# Patient Record
Sex: Male | Born: 1990 | Race: White | Hispanic: No | Marital: Single | Smoking: Former smoker
Health system: Southern US, Community
[De-identification: ages and names within clinical notes are randomized; demographics above are authoritative.]

## PROBLEM LIST (undated history)

## (undated) DIAGNOSIS — F329 Major depressive disorder, single episode, unspecified: Secondary | ICD-10-CM

## (undated) DIAGNOSIS — F32A Depression, unspecified: Secondary | ICD-10-CM

---
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
## 2011-09-21 ENCOUNTER — Emergency Department (HOSPITAL_COMMUNITY): Payer: No Typology Code available for payment source

## 2011-09-21 ENCOUNTER — Encounter: Payer: Self-pay | Admitting: Emergency Medicine

## 2011-09-21 ENCOUNTER — Emergency Department (HOSPITAL_COMMUNITY)
Admission: EM | Admit: 2011-09-21 | Discharge: 2011-09-21 | Disposition: A | Discharge status: Home / Self Care | Payer: No Typology Code available for payment source | Attending: Emergency Medicine | Admitting: Emergency Medicine

## 2011-09-21 DIAGNOSIS — R51 Headache: Secondary | ICD-10-CM | POA: Insufficient documentation

## 2011-09-21 DIAGNOSIS — S20219A Contusion of unspecified front wall of thorax, initial encounter: Secondary | ICD-10-CM | POA: Insufficient documentation

## 2011-09-21 MED ORDER — ONDANSETRON HCL 4 MG PO TABS
4.0000 mg | ORAL_TABLET | Freq: Once | ORAL | Status: AC
  Administered 2011-09-21: 4 mg via ORAL
  Filled 2011-09-21: qty 1

## 2011-09-21 MED ORDER — HYDROCODONE-ACETAMINOPHEN 5-325 MG PO TABS: ORAL_TABLET | ORAL | Status: DC

## 2011-09-21 MED ORDER — IBUPROFEN 800 MG PO TABS
800.0000 mg | ORAL_TABLET | Freq: Once | ORAL | Status: AC
  Administered 2011-09-21: 800 mg via ORAL
  Filled 2011-09-21: qty 1

## 2011-09-21 MED ORDER — HYDROCODONE-ACETAMINOPHEN 5-325 MG PO TABS
2.0000 | ORAL_TABLET | Freq: Once | ORAL | Status: AC
  Administered 2011-09-21: 2 via ORAL
  Filled 2011-09-21: qty 2

## 2011-09-21 MED ORDER — IBUPROFEN 800 MG PO TABS: 800.0000 mg | ORAL_TABLET | Freq: Three times a day (TID) | ORAL | Status: AC
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
## 2011-09-21 NOTE — ED Provider Notes (Signed)
Medical screening examination/treatment/procedure(s) were performed by non-physician practitioner and as supervising physician I was immediately available for consultation/collaboration.   Eisa Conaway L Jordyne Poehlman, MD 09/21/11 [phone] 

## 2013-03-27 ENCOUNTER — Emergency Department (HOSPITAL_COMMUNITY)
Admission: EM | Admit: 2013-03-27 | Discharge: 2013-03-27 | Disposition: A | Discharge status: Home / Self Care | Payer: Self-pay | Attending: Emergency Medicine | Admitting: Emergency Medicine

## 2013-03-27 ENCOUNTER — Encounter (HOSPITAL_COMMUNITY): Payer: Self-pay | Admitting: Emergency Medicine

## 2013-03-27 DIAGNOSIS — L0231 Cutaneous abscess of buttock: Secondary | ICD-10-CM | POA: Insufficient documentation

## 2013-03-27 MED ORDER — ONDANSETRON HCL 4 MG PO TABS
4.0000 mg | ORAL_TABLET | Freq: Once | ORAL | Status: AC
  Administered 2013-03-27: 4 mg via ORAL
  Filled 2013-03-27: qty 1

## 2013-03-27 MED ORDER — SULFAMETHOXAZOLE-TMP DS 800-160 MG PO TABS
1.0000 | ORAL_TABLET | Freq: Once | ORAL | Status: AC
  Administered 2013-03-27: 1 via ORAL
  Filled 2013-03-27: qty 1

## 2013-03-27 MED ORDER — AMOXICILLIN 500 MG PO CAPS: ORAL_CAPSULE | ORAL | Status: DC

## 2013-03-27 MED ORDER — SULFAMETHOXAZOLE-TRIMETHOPRIM 800-160 MG PO TABS: 1.0000 | ORAL_TABLET | Freq: Two times a day (BID) | ORAL | Status: DC

## 2013-03-27 MED ORDER — HYDROCODONE-ACETAMINOPHEN 5-325 MG PO TABS: 1.0000 | ORAL_TABLET | ORAL | Status: DC | PRN

## 2013-03-27 MED ORDER — KETOROLAC TROMETHAMINE 10 MG PO TABS
10.0000 mg | ORAL_TABLET | Freq: Once | ORAL | Status: AC
  Administered 2013-03-27: 10 mg via ORAL
  Filled 2013-03-27: qty 1

## 2013-03-27 MED ORDER — MELOXICAM 7.5 MG PO TABS: ORAL_TABLET | ORAL | Status: DC

## 2013-03-27 MED ORDER — PENICILLIN V POTASSIUM 250 MG PO TABS
500.0000 mg | ORAL_TABLET | Freq: Once | ORAL | Status: AC
  Administered 2013-03-27: 500 mg via ORAL
  Filled 2013-03-27: qty 2

## 2013-03-27 NOTE — ED Provider Notes (Signed)
Medical screening examination/treatment/procedure(s) were performed by non-physician practitioner and as supervising physician I was immediately available for consultation/collaboration.   Joya Gaskins, MD 03/27/13 [phone]

## 2013-03-27 NOTE — ED Notes (Signed)
Patient c/o abcess on left lower back x3 days. Denies any drainage. Denies any fevers.

## 2013-03-27 NOTE — ED Provider Notes (Signed)
History     CSN: [id number]  Arrival date & time 03/27/13  5621   First MD Initiated Contact with Patient 03/27/13 [phone]      Chief Complaint  Patient presents with  . Abscess    (Consider location/radiation/quality/duration/timing/severity/associated sxs/prior treatment) Patient is a 22 y.o. male presenting with abscess. The history is provided by the patient.  Abscess Location:  Ano-genital Ano-genital abscess location:  R buttock Abscess quality: painful and warmth   Abscess quality: not draining   Red streaking: no   Duration:  3 days Progression:  Worsening Pain details:    Quality:  Throbbing and sharp   Severity:  Moderate   Timing:  Constant   Progression:  Worsening Chronicity:  New Context: not diabetes and not immunosuppression   Relieved by:  Nothing Exacerbated by: sitting or walking. Ineffective treatments:  None tried Associated symptoms: no anorexia, no fever and no nausea     History reviewed. No pertinent past medical history.  History reviewed. No pertinent past surgical history.  History reviewed. No pertinent family history.  History  Substance Use Topics  . Smoking status: Never Smoker   . Smokeless tobacco: Never Used  . Alcohol Use: Yes     Comment: occ      Review of Systems  Constitutional: Negative for fever and activity change.       All ROS Neg except as noted in HPI  HENT: Negative for nosebleeds and neck pain.   Eyes: Negative for photophobia and discharge.  Respiratory: Negative for cough, shortness of breath and wheezing.   Cardiovascular: Negative for chest pain and palpitations.  Gastrointestinal: Negative for nausea, abdominal pain, blood in stool and anorexia.  Genitourinary: Negative for dysuria, frequency and hematuria.  Musculoskeletal: Negative for back pain and arthralgias.  Skin: Negative.        abscess  Neurological: Negative for dizziness, seizures and speech difficulty.  Psychiatric/Behavioral: Negative  for hallucinations and confusion.    Allergies  Review of patient's allergies indicates no known allergies.  Home Medications   Current Outpatient Rx  Name  Route  Sig  Dispense  Refill  . HYDROcodone-acetaminophen (NORCO) 5-325 MG per tablet      1 or 2 po q4h prn pain   24 tablet   0     BP 137/90  Pulse 99  Temp(Src) 97.7 F (36.5 C) (Oral)  Resp 18  Ht 5\' 11"  (1.803 m)  Wt 180 lb (81.647 kg)  BMI 25.12 kg/m2  SpO2 98%  Physical Exam  Nursing note and vitals reviewed. Constitutional: He is oriented to person, place, and time. He appears well-developed and well-nourished.  Non-toxic appearance.  HENT:  Head: Normocephalic.  Right Ear: Tympanic membrane and external ear normal.  Left Ear: Tympanic membrane and external ear normal.  Eyes: EOM and lids are normal. Pupils are equal, round, and reactive to light.  Neck: Normal range of motion. Neck supple. Carotid bruit is not present.  Cardiovascular: Normal rate, regular rhythm, normal heart sounds, intact distal pulses and normal pulses.   Pulmonary/Chest: Breath sounds normal. No respiratory distress.  Abdominal: Soft. Bowel sounds are normal. There is no tenderness. There is no guarding.  Genitourinary:  2.3cm painful warm abscess of the upper right buttock cheek. No red streaking. No anal involvement. No drainage.  Musculoskeletal: Normal range of motion.  Lymphadenopathy:       Head (right side): No submandibular adenopathy present.       Head (left side): No submandibular adenopathy  present.    He has no cervical adenopathy.  Neurological: He is alert and oriented to person, place, and time. He has normal strength. No cranial nerve deficit or sensory deficit.  Skin: Skin is warm and dry.  Psychiatric: He has a normal mood and affect. His speech is normal.    ED Course  Procedures (including critical care time)  Labs Reviewed - No data to display No results found.   No diagnosis found.    MDM  I  have reviewed nursing notes, vital signs, and all appropriate lab and imaging results for this patient. Patient presents to the emergency department with 3 days of increasing lower back area pain and discomfort related to an abscess of the buttocks cheek. The patient denies any fever or chills. The area is not draining. he has no history of methicillin-resistant staph arias. He has no immunocompromising issues. Patient given the option to have an incision and drainage here in the emergency department and he declines at this time. States he would rather see the surgeon which he might also have family members to help him with this procedure. Explained to the patient the importance of having this area drained to prevent advancing of infection and possible other medical sequela. Patient accepts the responsibility and states he'll see the surgeon soon. The plan at this time is for the patient to be on Norco one or 2 tablets every 4 hours as needed for pain, Amoxil 2 times daily, Septra 2 times daily, and Mobic 2 times daily. Patient is referred to Dr. Lovell Sheehan (surgery).        Kathie Dike, PA-C 03/27/13 [phone]

## 2015-02-12 ENCOUNTER — Encounter (HOSPITAL_COMMUNITY): Payer: Self-pay | Admitting: Emergency Medicine

## 2015-02-12 ENCOUNTER — Emergency Department (HOSPITAL_COMMUNITY)
Admission: EM | Admit: 2015-02-12 | Discharge: 2015-02-13 | Disposition: A | Discharge status: Other Acute Inpatient Hospital | Payer: 59 | Attending: Emergency Medicine | Admitting: Emergency Medicine

## 2015-02-12 DIAGNOSIS — F1210 Cannabis abuse, uncomplicated: Secondary | ICD-10-CM | POA: Insufficient documentation

## 2015-02-12 DIAGNOSIS — F329 Major depressive disorder, single episode, unspecified: Secondary | ICD-10-CM | POA: Insufficient documentation

## 2015-02-12 DIAGNOSIS — Z046 Encounter for general psychiatric examination, requested by authority: Secondary | ICD-10-CM | POA: Diagnosis present

## 2015-02-12 DIAGNOSIS — R45851 Suicidal ideations: Secondary | ICD-10-CM

## 2015-02-12 DIAGNOSIS — F1310 Sedative, hypnotic or anxiolytic abuse, uncomplicated: Secondary | ICD-10-CM | POA: Diagnosis not present

## 2015-02-12 DIAGNOSIS — Z720 Tobacco use: Secondary | ICD-10-CM | POA: Insufficient documentation

## 2015-02-12 DIAGNOSIS — F32A Depression, unspecified: Secondary | ICD-10-CM

## 2015-02-12 HISTORY — DX: Major depressive disorder, single episode, unspecified: F32.9

## 2015-02-12 HISTORY — DX: Depression, unspecified: F32.A

## 2015-02-12 LAB — URINALYSIS, ROUTINE W REFLEX MICROSCOPIC
BILIRUBIN URINE: NEGATIVE
GLUCOSE, UA: NEGATIVE mg/dL
Hgb urine dipstick: NEGATIVE
KETONES UR: NEGATIVE mg/dL
LEUKOCYTES UA: NEGATIVE
Nitrite: NEGATIVE
PROTEIN: NEGATIVE mg/dL
Specific Gravity, Urine: 1.015 (ref 1.005–1.030)
Urobilinogen, UA: 0.2 mg/dL (ref 0.0–1.0)
pH: 7.5 (ref 5.0–8.0)

## 2015-02-12 LAB — CBC WITH DIFFERENTIAL/PLATELET
BASOS ABS: 0 10*3/uL (ref 0.0–0.1)
BASOS PCT: 0 % (ref 0–1)
EOS ABS: 0.1 10*3/uL (ref 0.0–0.7)
EOS PCT: 1 % (ref 0–5)
HCT: 46.3 % (ref 39.0–52.0)
HEMOGLOBIN: 16.5 g/dL (ref 13.0–17.0)
LYMPHS PCT: 12 % (ref 12–46)
Lymphs Abs: 1.2 10*3/uL (ref 0.7–4.0)
MCH: 31.6 pg (ref 26.0–34.0)
MCHC: 35.6 g/dL (ref 30.0–36.0)
MCV: 88.7 fL (ref 78.0–100.0)
MONO ABS: 0.6 10*3/uL (ref 0.1–1.0)
Monocytes Relative: 6 % (ref 3–12)
Neutro Abs: 7.8 10*3/uL — ABNORMAL HIGH (ref 1.7–7.7)
Neutrophils Relative %: 81 % — ABNORMAL HIGH (ref 43–77)
PLATELETS: 212 10*3/uL (ref 150–400)
RBC: 5.22 MIL/uL (ref 4.22–5.81)
RDW: 12.3 % (ref 11.5–15.5)
WBC: 9.6 10*3/uL (ref 4.0–10.5)

## 2015-02-12 LAB — RAPID URINE DRUG SCREEN, HOSP PERFORMED
Amphetamines: NOT DETECTED
BARBITURATES: NOT DETECTED
Benzodiazepines: POSITIVE — AB
Cocaine: NOT DETECTED
OPIATES: NOT DETECTED
TETRAHYDROCANNABINOL: POSITIVE — AB

## 2015-02-12 LAB — BASIC METABOLIC PANEL
ANION GAP: 7 (ref 5–15)
BUN: 9 mg/dL (ref 6–23)
CALCIUM: 9.8 mg/dL (ref 8.4–10.5)
CO2: 24 mmol/L (ref 19–32)
CREATININE: 0.84 mg/dL (ref 0.50–1.35)
Chloride: 109 mmol/L (ref 96–112)
GFR calc Af Amer: >90 mL/min (ref >90)
GFR calc non Af Amer: >90 mL/min (ref >90)
Glucose, Bld: 103 mg/dL — ABNORMAL HIGH (ref 70–99)
Potassium: 4 mmol/L (ref 3.5–5.1)
Sodium: 140 mmol/L (ref 135–145)

## 2015-02-12 LAB — ETHANOL: Alcohol, Ethyl (B): <5 mg/dL (ref 0–9)

## 2015-02-12 MED ORDER — ALUM & MAG HYDROXIDE-SIMETH 200-200-20 MG/5ML PO SUSP: 30.0000 mL | ORAL | Status: DC | PRN

## 2015-02-12 MED ORDER — IBUPROFEN 400 MG PO TABS: 600.0000 mg | ORAL_TABLET | Freq: Three times a day (TID) | ORAL | Status: DC | PRN

## 2015-02-12 MED ORDER — ONDANSETRON HCL 4 MG PO TABS: 4.0000 mg | ORAL_TABLET | Freq: Three times a day (TID) | ORAL | Status: DC | PRN

## 2015-02-12 MED ORDER — ACETAMINOPHEN 325 MG PO TABS: 650.0000 mg | ORAL_TABLET | ORAL | Status: DC | PRN

## 2015-02-12 MED ORDER — STERILE WATER FOR INJECTION IJ SOLN
INTRAMUSCULAR | Status: AC
  Filled 2015-02-12: qty 10

## 2015-02-12 MED ORDER — NICOTINE 21 MG/24HR TD PT24
21.0000 mg | MEDICATED_PATCH | Freq: Every day | TRANSDERMAL | Status: DC
  Administered 2015-02-12: 21 mg via TRANSDERMAL
  Filled 2015-02-12: qty 1

## 2015-02-12 MED ORDER — LORAZEPAM 1 MG PO TABS: 1.0000 mg | ORAL_TABLET | Freq: Three times a day (TID) | ORAL | Status: DC | PRN

## 2015-02-12 MED ORDER — ZOLPIDEM TARTRATE 5 MG PO TABS: 5.0000 mg | ORAL_TABLET | Freq: Every evening | ORAL | Status: DC | PRN

## 2015-02-12 MED ORDER — ZIPRASIDONE MESYLATE 20 MG IM SOLR
20.0000 mg | Freq: Once | INTRAMUSCULAR | Status: AC
Start: 2015-02-12 — End: 2015-02-12
  Administered 2015-02-12: 20 mg via INTRAMUSCULAR
  Filled 2015-02-12: qty 20

## 2015-02-12 MED ORDER — LORAZEPAM 2 MG/ML IJ SOLN
1.0000 mg | Freq: Once | INTRAMUSCULAR | Status: AC
  Administered 2015-02-12: 1 mg via INTRAMUSCULAR
  Filled 2015-02-12: qty 1
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
## 2015-02-12 NOTE — ED Provider Notes (Signed)
Pt resting comfortably Law enforcement at bedside Awaiting placement   Joya Gaskinsonald W Siris Hoos, MD 02/13/15 0000

## 2015-02-12 NOTE — ED Notes (Signed)
[facility] to [facility] pt is to be kept here and have tts consult in am to see pts behavior.

## 2015-02-12 NOTE — ED Provider Notes (Addendum)
CSN: [id number]     Arrival date & time 02/12/15  1152 History   This chart was scribe for Brian Creasehristopher J. Joleah Combs, * by Brian Combs, ED Scribe. The patient was seen in room APA19/APA19 and the patient's care was started at 1:32 PM.     Chief Complaint  Patient presents with  . V70.1   The history is provided by the patient. No language interpreter was used.   HPI Comments: [name] is a 24 y.o. male who presents to the Emergency Department complaining of SI/  He reports that he is having trouble controlling his anger towards people and depression with harming himself. He reports associated SI and HI. He states that this has been going on for a while but he has never seen a doctor or a coundelor about the issue nor has he been in the hospital before.   Past Medical History  Diagnosis Date  . Depression    History reviewed. No pertinent past surgical history. History reviewed. No pertinent family history. History  Substance Use Topics  . Smoking status: Current Every Day Smoker -- 1.00 packs/day    Types: Cigarettes  . Smokeless tobacco: Never Used  . Alcohol Use: Yes     Comment: occ    Review of Systems  Psychiatric/Behavioral: Positive for suicidal ideas.  All other systems reviewed and are negative.     Allergies  Review of patient's allergies indicates no known allergies.  Home Medications   Prior to Admission medications   Medication Sig Start Date End Date Taking? Authorizing Provider  amoxicillin (AMOXIL) 500 MG capsule 2 po bid with food Patient not taking: Reported on 02/12/2015 03/27/13   Kathie DikeHobson M Bryant, PA-C  HYDROcodone-acetaminophen (NORCO/VICODIN) 5-325 MG per tablet Take 1 tablet by mouth every 4 (four) hours as needed for pain. Patient not taking: Reported on 02/12/2015 03/27/13   Kathie DikeHobson M Bryant, PA-C  ibuprofen (ADVIL) 200 MG tablet Take 400 mg by mouth every 6 (six) hours as needed for pain.    Historical Provider, MD  meloxicam (MOBIC) 7.5 MG  tablet 1 po bid with food Patient not taking: Reported on 02/12/2015 03/27/13   Kathie DikeHobson M Bryant, PA-C  sulfamethoxazole-trimethoprim (SEPTRA DS) 800-160 MG per tablet Take 1 tablet by mouth every 12 (twelve) hours. Patient not taking: Reported on 02/12/2015 03/27/13   Kathie DikeHobson M Bryant, PA-C   Pulse 86  Temp(Src) 99.3 F (37.4 C) (Oral)  Resp 18  Ht 5\' 11"  (1.803 m)  Wt 180 lb (81.647 kg)  BMI 25.12 kg/m2  SpO2 96% Physical Exam  Constitutional: He is oriented to person, place, and time. He appears well-developed and well-nourished. No distress.  HENT:  Head: Normocephalic and atraumatic.  Right Ear: Hearing normal.  Left Ear: Hearing normal.  Nose: Nose normal.  Mouth/Throat: Oropharynx is clear and moist and mucous membranes are normal.  Eyes: Conjunctivae and EOM are normal. Pupils are equal, round, and reactive to light.  Neck: Normal range of motion. Neck supple.  Cardiovascular: Regular rhythm, S1 normal and S2 normal.  Exam reveals no gallop and no friction rub.   No murmur heard. Pulmonary/Chest: Effort normal and breath sounds normal. No respiratory distress. He exhibits no tenderness.  Abdominal: Soft. Normal appearance and bowel sounds are normal. There is no hepatosplenomegaly. There is no tenderness. There is no rebound, no guarding, no tenderness at McBurney's point and negative Murphy's sign. No hernia.  Musculoskeletal: Normal range of motion.  Neurological: He is alert and oriented to  person, place, and time. He has normal strength. No cranial nerve deficit or sensory deficit. Coordination normal. GCS eye subscore is 4. GCS verbal subscore is 5. GCS motor subscore is 6.  Skin: Skin is warm, dry and intact. No rash noted. No cyanosis.  Psychiatric: His speech is normal. He is withdrawn. He exhibits a depressed mood. He expresses homicidal and suicidal ideation. He expresses suicidal plans.  Nursing note and vitals reviewed.   ED Course  Procedures (including critical care  time) DIAGNOSTIC STUDIES: Oxygen Saturation is 96% on RA, adequate by my interpretation.    COORDINATION OF CARE: 1:36 PM- Pt advised of plan for treatment and pt agrees.    Labs Review Labs Reviewed  CBC WITH DIFFERENTIAL/PLATELET - Abnormal; Notable for the following:    Neutrophils Relative % 81 (*)    Neutro Abs 7.8 (*)    All other components within normal limits  BASIC METABOLIC PANEL - Abnormal; Notable for the following:    Glucose, Bld 103 (*)    All other components within normal limits  URINE RAPID DRUG SCREEN (HOSP PERFORMED) - Abnormal; Notable for the following:    Benzodiazepines POSITIVE (*)    Tetrahydrocannabinol POSITIVE (*)    All other components within normal limits  URINALYSIS, ROUTINE W REFLEX MICROSCOPIC  ETHANOL    Imaging Review No results found.   EKG Interpretation None      MDM   Final diagnoses:  None   depression  Suicidal ideation  Patient presents to the ER for evaluation of depression. Patient reports progressively worsening depression and feeling like he wants to kill himself. Patient has formulated a plan to get the police to shoot him. He has also had thoughts of harming others. He reports that he has had trouble with his anger control, progressively worsening recently. Patient not currently receiving any treatment for his mental health issues. Medical clearance evaluation performed, patient is medically clear. He will require hospitalization for further psychiatric treatment.  Addendum: Patient became acutely agitated when he was told that he was being admitted to psychiatric hospital. Patient refusing to go, agitated, requiring sedation by police and chemical sedation.  I personally performed the services described in this documentation, which was scribed in my presence. The recorded information has been reviewed and is accurate.    Brian Crease, MD 02/12/15 1611  Brian Crease, MD 02/12/15 2118

## 2015-02-12 NOTE — BH Assessment (Signed)
Writer informed TTS Corrie Dandy(Mary and K.Charm BargesButler) of the consult.

## 2015-02-12 NOTE — ED Notes (Signed)
Pt states that he has been having thoughts of harming self.  Plan was to get police to shoot him.  States that he got into an argument with his child's mother this morning.  Has been having episodes of becoming very angry and cannot control self and then becomes depressed and suicidal.  States that this has been going on for a while and has never sought tx for this.

## 2015-02-12 NOTE — ED Notes (Signed)
Officer informed pt of transfer to [facility] and pt states he is not going. Pt is holding stool in threatening stance stating he is not going without a fight.

## 2015-02-13 ENCOUNTER — Inpatient Hospital Stay (HOSPITAL_COMMUNITY)
Admission: AD | Admit: 2015-02-13 | Discharge: 2015-02-15 | DRG: 897 | Disposition: A | Discharge status: Home / Self Care | Payer: 59 | Source: Intra-hospital | Attending: Psychiatry | Admitting: Psychiatry

## 2015-02-13 ENCOUNTER — Encounter (HOSPITAL_COMMUNITY): Payer: Self-pay | Admitting: *Deleted

## 2015-02-13 DIAGNOSIS — F1310 Sedative, hypnotic or anxiolytic abuse, uncomplicated: Secondary | ICD-10-CM | POA: Diagnosis present

## 2015-02-13 DIAGNOSIS — F419 Anxiety disorder, unspecified: Secondary | ICD-10-CM | POA: Diagnosis present

## 2015-02-13 DIAGNOSIS — F17210 Nicotine dependence, cigarettes, uncomplicated: Secondary | ICD-10-CM | POA: Diagnosis present

## 2015-02-13 DIAGNOSIS — F1924 Other psychoactive substance dependence with psychoactive substance-induced mood disorder: Secondary | ICD-10-CM

## 2015-02-13 DIAGNOSIS — R45851 Suicidal ideations: Secondary | ICD-10-CM | POA: Insufficient documentation

## 2015-02-13 DIAGNOSIS — F19239 Other psychoactive substance dependence with withdrawal, unspecified: Secondary | ICD-10-CM | POA: Diagnosis present

## 2015-02-13 DIAGNOSIS — F19980 Other psychoactive substance use, unspecified with psychoactive substance-induced anxiety disorder: Secondary | ICD-10-CM | POA: Diagnosis present

## 2015-02-13 DIAGNOSIS — F12280 Cannabis dependence with cannabis-induced anxiety disorder: Principal | ICD-10-CM | POA: Diagnosis present

## 2015-02-13 DIAGNOSIS — F332 Major depressive disorder, recurrent severe without psychotic features: Secondary | ICD-10-CM | POA: Diagnosis present

## 2015-02-13 DIAGNOSIS — F329 Major depressive disorder, single episode, unspecified: Secondary | ICD-10-CM

## 2015-02-13 DIAGNOSIS — F3289 Other specified depressive episodes: Secondary | ICD-10-CM

## 2015-02-13 DIAGNOSIS — F1220 Cannabis dependence, uncomplicated: Secondary | ICD-10-CM | POA: Diagnosis present

## 2015-02-13 MED ORDER — ACETAMINOPHEN 325 MG PO TABS
650.0000 mg | ORAL_TABLET | Freq: Four times a day (QID) | ORAL | Status: DC | PRN
  Administered 2015-02-14: 650 mg via ORAL
  Filled 2015-02-13: qty 2

## 2015-02-13 MED ORDER — TRAZODONE HCL 50 MG PO TABS
50.0000 mg | ORAL_TABLET | Freq: Every evening | ORAL | Status: DC | PRN
  Administered 2015-02-14: 50 mg via ORAL
  Filled 2015-02-13 (×2): qty 1
  Filled 2015-02-13: qty 28
  Filled 2015-02-13 (×3): qty 1
  Filled 2015-02-13 (×3): qty 28
  Filled 2015-02-13: qty 1

## 2015-02-13 MED ORDER — HYDROXYZINE HCL 25 MG PO TABS
25.0000 mg | ORAL_TABLET | Freq: Four times a day (QID) | ORAL | Status: DC | PRN
  Filled 2015-02-13: qty 20

## 2015-02-13 MED ORDER — IBUPROFEN 600 MG PO TABS: 600.0000 mg | ORAL_TABLET | Freq: Four times a day (QID) | ORAL | Status: DC | PRN

## 2015-02-13 MED ORDER — MAGNESIUM HYDROXIDE 400 MG/5ML PO SUSP: 30.0000 mL | Freq: Every day | ORAL | Status: DC | PRN

## 2015-02-13 MED ORDER — ALUM & MAG HYDROXIDE-SIMETH 200-200-20 MG/5ML PO SUSP: 30.0000 mL | ORAL | Status: DC | PRN

## 2015-02-13 NOTE — Tx Team (Signed)
Initial Interdisciplinary Treatment Plan   PATIENT STRESSORS: Financial difficulties Legal issue Marital or family conflict Occupational concerns   PATIENT STRENGTHS: Ability for insight Average or above average intelligence Capable of independent living General fund of knowledge Supportive family/friends Work skills   PROBLEM LIST: Problem List/Patient Goals Date to be addressed Date deferred Reason deferred Estimated date of resolution  Depression over unemployment- feeling overwhelmed      Anxiety      Risk for self harm            "I want to do what I need to do to get out of here"      "I know I have anger issues so if ya'll can help me with that, that would be good"                         DISCHARGE CRITERIA:  Ability to meet basic life and health needs Improved stabilization in mood, thinking, and/or behavior Motivation to continue treatment in a less acute level of care Reduction of life-threatening or endangering symptoms to within safe limits Verbal commitment to aftercare and medication compliance  PRELIMINARY DISCHARGE PLAN: Attend aftercare/continuing care group Outpatient therapy Return to previous living arrangement  PATIENT/FAMIILY INVOLVEMENT: This treatment plan has been presented to and reviewed with the patient, Brian Combs, and/or family member.  The patient and family have been given the opportunity to ask questions and make suggestions.  Jesus GeneraSpeagle, Brian Combs [facility] 02/13/2015, 9:03 PM

## 2015-02-13 NOTE — Consult Note (Signed)
Telepsych Consultation   Reason for Consult:  Suicidal Ideation Referring Physician:  EDP Patient Identification: Brian Combs MRN:  [id number] Principal Diagnosis: Suicidal ideation Diagnosis:  There are no active problems to display for this patient.   Total Time spent with patient: 25 minutes  Subjective:   Brian T Bloxham is a 24 y.o. male patient admitted with reports that he attempted to have the police shoot him. Pt seen and chart reviewed. Pt denies SI, HI, and AVH. However, when asked how the pt felt during the situation with the police, he stated "I definitely wanted the police to shoot me; I was mad about an argument with my family, but yeah I definitely wanted them to kill me". Pt informed that this is a serious concern and that he would benefit from inpatient. Pt hesitant but in agreement that he could benefit from inpatient psychaitric stabilization.   HPI: Brian T Belvin is an 24 y.o. male presents to [facility] with the police. Pt was angry earlier today and his grandmother call the police. When the police arrived at the Pt's house, Pt tried to get the police to shoot him. Pt was taken to [facility] by the police. Pt reports current SI. Pt reports a history of SI and HI. Pt reports feeling depressed for 3 years without any remittence. Pt reports that when he feels depressed, he wants to hurt himself. Pt reports that when he gets angry, he often feels like hurting anyone standing around him. Pt reports getting 2 to 3 hours of sleep a night, is short tempered, irritable, and isolates himself from other people.Pt lives with his grandparents, mother and sister and feels supported by them all. Pt admits to feeling that weight of the world on his shoulders. Pt reports that his responsibilities do not feel impossible but they do feel very difficult. Pt reports worrying about his 413 month old son. Pt denies any substance abuse, A/V hallucinations or delusions. Pt denies any prior history of treatment.  Per Nanine MeansJamison Lord, NP Pt meets inpatient criteria. [facility].   HPI Elements:   Location:  Psychiatric. Quality:  Worsening. Severity:  Severe. Timing:  Intermittent. Duration:  Transient. Context:  Severe exacerbation of underlying behavior disburbance and MDD secondary to altercation with family.  Past Medical History:  Past Medical History  Diagnosis Date  . Depression    History reviewed. No pertinent past surgical history. Family History: History reviewed. No pertinent family history. Social History:  History  Alcohol Use  . Yes    Comment: occ     History  Drug Use  . Yes  . Special: Marijuana    Comment: uds positive for marijuana    History   Social History  . Marital Status: Single    Spouse Name: N/A  . Number of Children: N/A  . Years of Education: N/A   Social History Main Topics  . Smoking status: Current Every Day Smoker -- 1.00 packs/day    Types: Cigarettes  . Smokeless tobacco: Never Used  . Alcohol Use: Yes     Comment: occ  . Drug Use: Yes    Special: Marijuana     Comment: uds positive for marijuana  . Sexual Activity: Not on file   Other Topics Concern  . None   Social History Narrative   Additional Social History:    Pain Medications: none Prescriptions: none Over the Counter: none History of alcohol / drug use?: No history of alcohol /  drug abuse Longest period of sobriety (when/how long): na Negative Consequences of Use:  (na) Withdrawal Symptoms:  (na)                     Allergies:  No Known Allergies  Vitals: Blood pressure 133/76, pulse 82, temperature 97.8 F (36.6 C), temperature source Oral, resp. rate 20, height  (1.803 m), weight 81.647 kg (180 lb), SpO2 100 %.  Risk to Self: Suicidal Ideation: Yes-Currently Present Suicidal Intent: Yes-Currently Present Is patient at risk for suicide?: Yes Suicidal Plan?: Yes-Currently Present Specify Current Suicidal Plan: death by  cop Access to Means: Yes Specify Access to Suicidal Means: can walk in front of a cop What has been your use of drugs/alcohol within the last 12 months?: drinks once a month (a few shots, not enough to be intoxicated) How many times?: 0 Other Self Harm Risks: 0 Triggers for Past Attempts: None known Intentional Self Injurious Behavior: None Risk to Others: Homicidal Ideation: No-Not Currently/Within Last 6 Months Thoughts of Harm to Others: No-Not Currently Present/Within Last 6 Months Current Homicidal Intent: No-Not Currently/Within Last 6 Months Current Homicidal Plan: No-Not Currently/Within Last 6 Months Access to Homicidal Means: No Identified Victim: anyone around him when he is angry History of harm to others?: No Assessment of Violence: None Noted Violent Behavior Description: pt denied Does patient have access to weapons?: No Criminal Charges Pending?: No Does patient have a court date: No Prior Inpatient Therapy: Prior Inpatient Therapy: No Prior Outpatient Therapy: Prior Outpatient Therapy: No  Current Facility-Administered Medications  Medication Dose Route Frequency Provider Last Rate Last Dose  . acetaminophen (TYLENOL) tablet 650 mg  650 mg Oral Q4H PRN Gilda Crease, MD      . alum & mag hydroxide-simeth (MAALOX/MYLANTA) 200-200-20 MG/5ML suspension 30 mL  30 mL Oral PRN Gilda Crease, MD      . ibuprofen (ADVIL,MOTRIN) tablet 600 mg  600 mg Oral Q8H PRN Gilda Crease, MD      . LORazepam (ATIVAN) tablet 1 mg  1 mg Oral Q8H PRN Gilda Crease, MD      . nicotine (NICODERM CQ - dosed in mg/24 hours) patch 21 mg  21 mg Transdermal Daily Gilda Crease, MD   21 mg at 02/12/15 1540  . ondansetron (ZOFRAN) tablet 4 mg  4 mg Oral Q8H PRN Gilda Crease, MD      . sterile water (preservative free) injection           . zolpidem (AMBIEN) tablet 5 mg  5 mg Oral QHS PRN Gilda Crease, MD       Current Outpatient  Prescriptions  Medication Sig Dispense Refill  . amoxicillin (AMOXIL) 500 MG capsule 2 po bid with food (Patient not taking: Reported on 02/12/2015) 28 capsule 0  . HYDROcodone-acetaminophen (NORCO/VICODIN) 5-325 MG per tablet Take 1 tablet by mouth every 4 (four) hours as needed for pain. (Patient not taking: Reported on 02/12/2015) 20 tablet 0  . ibuprofen (ADVIL) 200 MG tablet Take 400 mg by mouth every 6 (six) hours as needed for pain.    . meloxicam (MOBIC) 7.5 MG tablet 1 po bid with food (Patient not taking: Reported on 02/12/2015) 14 tablet 0  . sulfamethoxazole-trimethoprim (SEPTRA DS) 800-160 MG per tablet Take 1 tablet by mouth every 12 (twelve) hours. (Patient not taking: Reported on 02/12/2015) 14 tablet 0    Musculoskeletal: UTO, camera   Psychiatric Specialty Exam:  Blood pressure 133/76, pulse 82, temperature 97.8 F (36.6 C), temperature source Oral, resp. rate 20, height  (1.803 m), weight 81.647 kg (180 lb), SpO2 100 %.Body mass index is 25.12 kg/(m^2).  General Appearance: Casual and Fairly Groomed  Patent attorney::  Good  Speech:  Clear and Coherent and Normal Rate  Volume:  Normal  Mood:  Depressed  Affect:  Depressed  Thought Process:  Coherent and Goal Directed  Orientation:  Full (Time, Place, and Person)  Thought Content:  WDL  Suicidal Thoughts:  Yes.  with intent/plan  Homicidal Thoughts:  No  Memory:  Immediate;   Fair Recent;   Fair Remote;   Fair  Judgement:  Fair  Insight:  Fair  Psychomotor Activity:  Normal  Concentration:  Good  Recall:  Good  Fund of Knowledge:Good  Language: Good  Akathisia:  No  Handed:    AIMS (if indicated):     Assets:  Communication Skills Desire for Improvement Resilience Social Support  ADL's:  Intact  Cognition: WNL  Sleep:      Medical Decision Making: Review of Psycho-Social Stressors (1), Review or order clinical lab tests (1) and Established Problem, Worsening (2)   Treatment Plan Summary: Daily  contact with patient to assess and evaluate symptoms and progress in treatment  Plan:  Recommend psychiatric Inpatient admission when medically cleared.  Disposition:  -Admit to inpatient for safety and psychiatric stabilization  Beau Fanny, FNP-BC 02/13/2015 9:16 AM

## 2015-02-13 NOTE — Progress Notes (Signed)
Invol admit, 24 yo caucasian male, to the 500 hall after police were called to his home when he became irate at this grandmother and made threatening statements to family.  Police reported that he was trying to get them to shoot him.  Pt states that the incident was a misunderstanding and that he was never suicidal.  He said that he lost his job and now he is having financial difficulty.  He and his girlfriend just had a baby and he feels responsible to care and support his child.  With no income, he was feeling overwhelmed and out of control.  He said when the police arrived, "I just lost it".  Pt admits he has anger issues and says he needs to work on that.  He says he has been incarcerated before for selling drugs, but that he does not do that anymore.  Pt was pleasant and cooperative with the admission process.  He reports occasional ETOH and THC use.  He denies any major medical issues.  He signed all paperwork.  Search was completed.  He did not have any belongings to lock up, because they were taken home by family from the ED per patient.  Pt was oriented to unit/room and given a meal.  Safety checks q15 minutes were initiated.

## 2015-02-13 NOTE — [facility] Counselor (Signed)
TC from Jenel LucksSarah Morris at [facility] [phone]. Maralyn SagoSarah reports she spoke w/ pt and that pt understands he has to behave during transport. Maralyn SagoSarah says that pt promises to remain calm during transport as he understands acting disruptively will prolong his hospital stay.   Evette Cristalaroline Paige Destynie Toomey, [place] Therapeutic Triage Specialist

## 2015-02-13 NOTE — ED Notes (Signed)
Brian LucksSarah Morris from [facility] speaking with pt

## 2015-02-14 ENCOUNTER — Encounter (HOSPITAL_COMMUNITY): Payer: Self-pay | Admitting: Psychiatry

## 2015-02-14 DIAGNOSIS — F1924 Other psychoactive substance dependence with psychoactive substance-induced mood disorder: Secondary | ICD-10-CM

## 2015-02-14 DIAGNOSIS — F3289 Other specified depressive episodes: Secondary | ICD-10-CM

## 2015-02-14 DIAGNOSIS — F19980 Other psychoactive substance use, unspecified with psychoactive substance-induced anxiety disorder: Secondary | ICD-10-CM

## 2015-02-14 DIAGNOSIS — F19239 Other psychoactive substance dependence with withdrawal, unspecified: Secondary | ICD-10-CM | POA: Diagnosis present

## 2015-02-14 DIAGNOSIS — F1220 Cannabis dependence, uncomplicated: Secondary | ICD-10-CM | POA: Diagnosis present

## 2015-02-14 DIAGNOSIS — F1310 Sedative, hypnotic or anxiolytic abuse, uncomplicated: Secondary | ICD-10-CM

## 2015-02-14 LAB — HEPATIC FUNCTION PANEL
ALK PHOS: 59 U/L (ref 39–117)
ALT: 83 U/L — AB (ref 0–53)
AST: 50 U/L — ABNORMAL HIGH (ref 0–37)
Albumin: 5.5 g/dL — ABNORMAL HIGH (ref 3.5–5.2)
BILIRUBIN DIRECT: 0.1 mg/dL (ref 0.0–0.5)
Indirect Bilirubin: 0.8 mg/dL (ref 0.3–0.9)
Total Bilirubin: 0.9 mg/dL (ref 0.3–1.2)
Total Protein: 8.6 g/dL — ABNORMAL HIGH (ref 6.0–8.3)

## 2015-02-14 LAB — LIPID PANEL
Cholesterol: 262 mg/dL — ABNORMAL HIGH (ref 0–200)
HDL: 37 mg/dL — ABNORMAL LOW (ref >39)
LDL Cholesterol: 195 mg/dL — ABNORMAL HIGH (ref 0–99)
Total CHOL/HDL Ratio: 7.1 RATIO
Triglycerides: 150 mg/dL — ABNORMAL HIGH (ref <150)
VLDL: 30 mg/dL (ref 0–40)

## 2015-02-14 LAB — TSH: TSH: 1.223 u[IU]/mL (ref 0.350–4.500)

## 2015-02-14 MED ORDER — GABAPENTIN 100 MG PO CAPS
100.0000 mg | ORAL_CAPSULE | ORAL | Status: DC
  Administered 2015-02-14 – 2015-02-15 (×4): 100 mg via ORAL
  Filled 2015-02-14: qty 42
  Filled 2015-02-14 (×2): qty 1
  Filled 2015-02-14 (×3): qty 42
  Filled 2015-02-14: qty 1
  Filled 2015-02-14 (×2): qty 42
  Filled 2015-02-14 (×4): qty 1

## 2015-02-14 MED ORDER — NICOTINE 21 MG/24HR TD PT24
21.0000 mg | MEDICATED_PATCH | Freq: Every day | TRANSDERMAL | Status: DC
  Administered 2015-02-14: 21 mg via TRANSDERMAL
  Filled 2015-02-14 (×4): qty 1

## 2015-02-14 NOTE — Progress Notes (Signed)
D: Pt observed pacing halls at intervals and interacting well with peers in dayroom.  A: medication given as per MD's order including PRN Tylenol for c/o headache. Emotional support and availability offered. 1:1 contact made with pt for shift assessment. Q 15 minutes checks maintained for suicide without gestures or event of self harm to note at this time.  R: Pt has been cooperative with care and unit routines. Compliant with ordered medication when offered.  Denied SI / HI and AVH when assessed. Pt presents with a congruent affect and anxious mood, however, pt denied feeling anxious despite pacing motion in hall. Pt stated he's just worried about taking care of his family (fiancee and new born baby) without a job. Verbalized his concerns / needs appropriately. Pt rated his depression 5/10, denied hopelessness and anxiety on self inventory sheet. Pt's goal " To do whatever it takes to get home to my son". Mr. [place] remains safe on / off unit. No behavioral outburst to note thus far this shift.

## 2015-02-14 NOTE — Plan of Care (Signed)
Problem: Diagnosis: Increased Risk For Suicide Attempt Goal: STG-Patient Will Comply With Medication Regime Outcome: Progressing Pt took his medications as ordered when offered. Verbal education done on medication to promote continued compliance.

## 2015-02-14 NOTE — [facility] Suicide Risk Assessment (Signed)
[facility] INPATIENT:  Family/Significant Other Suicide Prevention Education  Suicide Prevention Education:  Education Completed; Brian Combs (Pt's grandmother) [phone] has been identified by the patient as the family member/significant other with whom the patient will be residing, and identified as the person(s) who will aid the patient in the event of a mental health crisis (suicidal ideations/suicide attempt).  With written consent from the patient, the family member/significant other has been provided the following suicide prevention education, prior to the and/or following the discharge of the patient.  The suicide prevention education provided includes the following:  Suicide risk factors  Suicide prevention and interventions  National Suicide Hotline telephone number  [facility] assessment telephone number  [facility] Emergency Assistance 911  [facility] and/or Residential Mobile Crisis Unit telephone number  Request made of family/significant other to:  Remove weapons (e.g., guns, rifles, knives), all items previously/currently identified as safety concern.    Remove drugs/medications (over-the-counter, prescriptions, illicit drugs), all items previously/currently identified as a safety concern.  The family member/significant other verbalizes understanding of the suicide prevention education information provided.  The family member/significant other agrees to remove the items of safety concern listed above.  Pt's grandmother reports no issue with pt returning home at d/c. SPE completed with her. CSW informed pt's grandmother that pt will be sent home with anger management resources and will have follow-up appt for med management/therapy assessment.   Smart, Rolla Kedzierski LCSWA 02/14/2015, 1:39 PM

## 2015-02-14 NOTE — [facility] Group Notes (Signed)
[facility] Group Notes:  (Nursing--Leisure & Lifestyle Changes)  Date:  02/14/2015  Time:  0930  Type of Therapy:  Psychoeducational Skills  Participation Level:  Active  Participation Quality:  Appropriate, Attentive, Sharing and Supportive  Affect:  Anxious  Cognitive:  Alert and Appropriate  Insight:  Good  Engagement in Group:  Engaged and Supportive  Modes of Intervention:  Discussion Pt attended and verbally participated in scheduled groups.  Summary of Progress/Problems:  Brian Combs, Brian Combs 02/14/2015, 0930

## 2015-02-14 NOTE — [facility] Group Notes (Signed)
[facility] LCSW Group Therapy  02/14/2015 1:35 PM  Type of Therapy:  Group Therapy  Participation Level:  Active  Participation Quality:  Attentive  Affect:  Appropriate  Cognitive:  Alert and Oriented  Insight:  Improving  Engagement in Therapy:  Engaged  Modes of Intervention:  Discussion, Education, Exploration, Problem-solving, Rapport Building, Socialization and Support  Summary of Progress/Problems: [facility] Speaker came to talk about his personal journey with substance abuse and addiction. The pt processed ways by which to relate to the speaker. [facility]HA speaker provided handouts and educational information pertaining to groups and services offered by [facility].   Brian Combs, Brian Combs LCSWA  02/14/2015, 1:35 PM

## 2015-02-14 NOTE — Tx Team (Signed)
Interdisciplinary Treatment Plan Update (Adult)   Date: 02/14/2015   Time Reviewed: 8:51 AM  Progress in Treatment:  Attending groups: No-new to unit.  Participating in groups: No-new to unit.   Taking medication as prescribed: Yes  Tolerating medication: Yes  Family/Significant othe contact made: Not yet. SPE required for this pt.  Patient understands diagnosis: Pt IVCed but acknowledges need for inpatient hospitalization-  AEB seeking treatment for mood instability/anger/increased depression, medication stabilization, and SI. Discussing patient identified problems/goals with staff: Yes  Medical problems stabilized or resolved: Yes  Denies suicidal/homicidal ideation: Yes during self report.  Patient has not harmed self or Others: Yes  New problem(s) identified:  Discharge Plan or Barriers: CSW assessing for appropriate referrals. PSA required for this pt within 72 hours of admission.  Additional comments: Invol admit, 24 yo caucasian male, to the [facility] hall after police were called to his home when he became irate at this grandmother and made threatening statements to family. Police reported that he was trying to get them to shoot him. Pt states that the incident was a misunderstanding and that he was never suicidal. He said that he lost his job and now he is having financial difficulty. He and his girlfriend just had a baby and he feels responsible to care and support his child. With no income, he was feeling overwhelmed and out of control. He said when the police arrived, "I just lost it". Pt admits he has anger issues and says he needs to work on that. He says he has been incarcerated before for selling drugs, but that he does not do that anymore. Pt was pleasant and cooperative with the admission process. He reports occasional ETOH and THC use. He denies any major medical issues.  Reason for Continuation of Hospitalization: Mood instability/impulsivity/anger/depression Medication  stabiliztion Estimated length of stay: 3-5 days  For review of initial/current patient goals, please see plan of care.  Attendees:  Patient:    Family:    Physician: Geoffery LyonsIrving Lugo MD/Dr. Cobos/Dr. Elna BreslowEappen 02/14/2015 8:54 AM   Nursing:  02/14/2015 8:54 AM   Clinical Social Worker [employer] Sherwin-WilliamsHeather Smart, LCSWA  02/14/2015 8:55 AM   Other: Earley AbideKristin D. LCSWA; Quylle H. LCSW 02/14/2015 8:55 AM   Other: Darden DatesJennifer C. Nurse CM 02/14/2015 8:55 AM   Other: Liliane Badeolora Sutton, Community Care Coordinator  02/14/2015 8:55 AM   Other: Vikki PortsValerie; [facility]  02/14/2015 8:55 AM   Scribe for Treatment Team:  [employer] Sherwin-WilliamsHeather Smart LCSWA 02/14/2015 8:55 AM

## 2015-02-14 NOTE — [facility] Suicide Risk Assessment (Signed)
[facility]   Nursing information obtained from:  Patient Demographic factors:  Male, Adolescent or young adult, Caucasian, Low socioeconomic status, Unemployed Current Mental Status:  NA (Denies SI) Loss Factors:  Legal issues, Financial problems / change in socioeconomic status Historical Factors:  Impulsivity Risk Reduction Factors:  Responsible for children under 23 years of age, Sense of responsibility to family, Living with another person, especially a relative, Positive social support Total Time spent with patient: 30 minutes Principal Problem: Cannabis use disorder, moderate, dependence Diagnosis:   Patient Active Problem List   Diagnosis Date Noted  . Cannabis use disorder, moderate, dependence [F12.20] 02/14/2015  . 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  World Science writerHealth Organization [facility](WHO). Score between 0-7:  no or low risk or alcohol related problems. Score between 8-15:  moderate risk of alcohol related problems. Score between 16-19:  high risk of alcohol related problems. Score 20 or above:  warrants further diagnostic evaluation for alcohol dependence and treatment.   CLINICAL FACTORS:   Alcohol/Substance Abuse/Dependencies   Musculoskeletal: Strength & Muscle Tone: within normal limits Gait & Station: normal Patient leans: N/A  Psychiatric Specialty Exam: Physical Exam  Review of Systems  Psychiatric/Behavioral: Positive for substance abuse.    Blood pressure 134/81, pulse 115, temperature 97.5 F (36.4 C), temperature source Oral, resp. rate 20, height 5' 8.5" (1.74 m), weight 78.019 kg (172 lb), SpO2 99 %.Body mass index  is 25.77 kg/(m^2).              Please see H&P.                                            SUICIDE RISK:   Mild:  Suicidal ideation of limited frequency, intensity, duration, and specificity.  There are no identifiable plans, no associated intent, mild dysphoria and related symptoms, good self-control (both objective and subjective assessment), few other risk factors, and identifiable protective factors, including available and accessible social support.  PLAN OF CARE: Please see H&P.   Medical Decision Making:  Review of Psycho-Social Stressors (1), Review or order clinical lab tests (1), Review of Medication Regimen & Side Effects (2) and Review of New Medication or Change in Dosage (2)  I certify that inpatient services furnished can reasonably be expected to improve the patient's condition.   Brian Noren md 02/14/2015, 8:46 AM

## 2015-02-14 NOTE — H&P (Addendum)
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 Cannabis use disorder, moderate, dependence [F12.20] 02/14/2015  . 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 Caucasian male who presented to [facility] with the police. Pt per initial notes in EHR was angry  and his grandmother call the police. When the police arrived at the Pt's house, Pt tried to get the police to shoot him. Pt was taken to [facility] by the police. Pt also reported current SI while in ED.  Patient seen this AM. Patient presented as very calm and pleasant. Patient reports that he lives with his grandmother , grand pa, mother and his sister in [place]. Patient reported that he was having a an altercation with his girlfriend at his grandma's place . His grandmother called police and when police came ,he thought they were going to take him to jail. He had just gotten out of jail for drug charges and this upset him. He got so upset and asked the police to shoot him before they separate him from his son (45 months old). He reported that his statement was taken as "he was suicidal' and he was brought to ED. He was agitated in ED because he wanted to leave and they would not. Patient reports that he calmed down later on since they told him that he would released. But instead he was send  to [facility] unit.  Patient reports anger management issues since a long time. Patient reports that when gets upset , it can last for an hour or so and he is back to normal. He throws things and gets verbally abusive. Patient otherwise denies any mood swings , depression ,anxiety, sleep issues, SI/HI/AH/VH.  Patient reports a hx of substance abuse -reports abusing marijuana regularly. He also reports using xanax on and off , he borrows it from his grand father once in a while. He denies any withdrawal sx.  Collateral information was obtained from gandmother at [phone] - who reported the same thing that patient had said earlier. She wants him to get anger management classes on discharge.   Elements:  Location:  anger issues, mood swings ,substance abuse. Quality:  anger issues , can get evrbally abusive and throw stuff, but gets back to his ususal self in an hour or so, marijuana abuse ,xanax abuse. Severity:  moderate. Timing:  periodically. Duration:  past few months, worsening. Context:  substance abuse. Associated Signs/Symptoms: Depression Symptoms:  denies depressive sx (Hypo) Manic Symptoms:  Irritable Mood, Anxiety Symptoms:  denies anxiety issues Psychotic Symptoms:  denies PTSD Symptoms: NA Total Time spent with patient: 1 hour  Past Medical History:  Past Medical History  Diagnosis Date  . Depression    History reviewed. No pertinent past surgical history. Family History:  Family History  Problem Relation Age of Onset  . Alcohol abuse Father    Social History:  History  Alcohol Use  . Yes    Comment: occ  History  Drug Use  . Yes  . Special: Marijuana, Benzodiazepines    Comment: uds positive for marijuana    History   Social History  . Marital Status: Single    Spouse Name: N/A  . Number of Children: N/A  . Years of Education: N/A   Social History Main Topics  . Smoking status: Current Every Day Smoker -- 1.00 packs/day    Types: Cigarettes  .  Smokeless tobacco: Never Used  . Alcohol Use: Yes     Comment: occ  . Drug Use: Yes    Special: Marijuana, Benzodiazepines     Comment: uds positive for marijuana  . Sexual Activity: Yes    Birth Control/ Protection: None   Other Topics Concern  . None   Social History Narrative   Additional Social History:    Pain Medications: See home med list Prescriptions: See home med list Over the Counter: See home med list History of alcohol / drug use?: Yes (Pt reports occasional ETOH and THC use) Negative Consequences of Use: Personal relationships Withdrawal Symptoms:  (none)        Patient was born in [place]. Raised by mother and step father. His father was in and out of prison. 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 Patient denies hx of sexual or physical abuse. Patient reports having a relationship with his current GF since the past >1 yr and they have a 1 month old son. Patient denies ever being in [employer].             Musculoskeletal: Strength & Muscle Tone: within normal limits Gait & Station: normal Patient leans: N/A  Psychiatric Specialty Exam: Physical Exam  Review of Systems  Psychiatric/Behavioral: Positive for substance abuse. The patient is nervous/anxious.     Blood pressure 134/81, pulse 115, temperature 97.5 F (36.4 C), temperature source Oral, resp. rate 20, height 5' 8.5" (1.74 m), weight 78.019 kg (172 lb), SpO2 99 %.Body mass index is 25.77 kg/(m^2).  General Appearance: Fairly Groomed  Patent attorney::  Good  Speech:  Clear and Coherent  Volume:  Normal  Mood:  Anxious  Affect:  Appropriate  Thought Process:  Coherent  Orientation:  Full (Time, Place, and Person)  Thought Content:  WDL  Suicidal Thoughts:  No  Homicidal Thoughts:  No  Memory:  Immediate;   Fair Recent;   Fair Remote;   Fair  Judgement:  Poor  Insight:  Fair  Psychomotor  Activity:  Normal  Concentration:  Fair  Recall:  [employer] of Knowledge:Fair  Language: Fair  Akathisia:  No  Handed:  Right  AIMS (if indicated):     Assets:  Communication Skills Desire for Improvement Housing Intimacy Physical Health Social Support Talents/Skills  ADL's:  Intact  Cognition: WNL  Sleep:      Risk to Self: Is patient at risk for suicide?: No What has been your use of drugs/alcohol within the last 12 months?: drinks and smokes marijuana "less than twice a month." social drinker and marijuana smoker.  Risk to Others:  denies Prior Inpatient Therapy:  denies Prior Outpatient Therapy:  yes - went to mental health clinic years ago for mood sx,but was not compliant with medications  Alcohol Screening: 1. How often do you have a drink containing alcohol?: Monthly or less 2. How many drinks containing alcohol do you have on a  typical day when you are drinking?: 3 or 4 3. How often do you have six or more drinks on one occasion?: Never Preliminary Score: 1 4. How often during the last year have you found that you were not able to stop drinking once you had started?: Never 5. How often during the last year have you failed to do what was normally expected from you becasue of drinking?: Never 6. How often during the last year have you needed a first drink in the morning to get yourself going after a heavy drinking session?: Never 7. How often during the last year have you had a feeling of guilt of remorse after drinking?: Never 8. How often during the last year have you been unable to remember what happened the night before because you had been drinking?: Never 9. Have you or someone else been injured as a result of your drinking?: No 10. Has a relative or friend or a doctor or another health worker been concerned about your drinking or suggested you cut down?: No Alcohol Use Disorder Identification Test Final Score (AUDIT): 2 Brief Intervention: AUDIT score less than 7  or less-screening does not suggest unhealthy drinking-brief intervention not indicated  Allergies:   Allergies  Allergen Reactions  . Peanut-Containing Drug Products Anaphylaxis   Lab Results:  Results for orders placed or performed during the hospital encounter of 02/13/15 (from the past 48 hour(s))  TSH     Status: None   Collection Time: 02/14/15  6:40 AM  Result Value Ref Range   TSH 1.223 0.350 - 4.500 uIU/mL    Comment: Performed at [facility]  Hepatic function panel     Status: Abnormal   Collection Time: 02/14/15  6:40 AM  Result Value Ref Range   Total Protein 8.6 (H) 6.0 - 8.3 g/dL   Albumin 5.5 (H) 3.5 - 5.2 g/dL   AST 50 (H) 0 - 37 U/L   ALT 83 (H) 0 - 53 U/L   Alkaline Phosphatase 59 39 - 117 U/L   Total Bilirubin 0.9 0.3 - 1.2 mg/dL   Bilirubin, Direct 0.1 0.0 - 0.5 mg/dL   Indirect Bilirubin 0.8 0.3 - 0.9 mg/dL    Comment: Performed at [facility]  Lipid panel, fasting     Status: Abnormal   Collection Time: 02/14/15  6:40 AM  Result Value Ref Range   Cholesterol 262 (H) 0 - 200 mg/dL   Triglycerides 161 (H) <150 mg/dL   HDL 37 (L) >09 mg/dL   Total CHOL/HDL Ratio 7.1 RATIO   VLDL 30 0 - 40 mg/dL   LDL Cholesterol 604 (H) 0 - 99 mg/dL    Comment:        Total Cholesterol/HDL:CHD Risk Coronary Heart Disease Risk Table                     Men   Women  1/2 Average Risk   3.4   3.3  Average Risk       5.0   4.4  2 X Average Risk   9.6   7.1  3 X Average Risk  23.4   11.0        Use the calculated Patient Ratio above and the CHD Risk Table to determine the patient's CHD Risk.        ATP III CLASSIFICATION (LDL):  <100     mg/dL   Optimal  540-981  mg/dL   Near or Above  Optimal  130-159  mg/dL   Borderline  [id number]  mg/dL   High  >130>190     mg/dL   Very High [facility]    Current Medications: Current Facility-Administered Medications  Medication Dose Route Frequency  Provider Last Rate Last Dose  . acetaminophen (TYLENOL) tablet 650 mg  650 mg Oral Q6H PRN Kerry HoughSpencer E Simon, PA-C   650 mg at 02/14/15 1035  . alum & mag hydroxide-simeth (MAALOX/MYLANTA) 200-200-20 MG/5ML suspension 30 mL  30 mL Oral Q4H PRN Kerry HoughSpencer E Simon, PA-C      . gabapentin (NEURONTIN) capsule 100 mg  100 mg Oral [id number] Jashaun Penrose, MD      . hydrOXYzine (ATARAX/VISTARIL) tablet 25 mg  25 mg Oral Q6H PRN Kerry HoughSpencer E Simon, PA-C      . ibuprofen (ADVIL,MOTRIN) tablet 600 mg  600 mg Oral Q6H PRN Kerry HoughSpencer E Simon, PA-C      . magnesium hydroxide (MILK OF MAGNESIA) suspension 30 mL  30 mL Oral Daily PRN Kerry HoughSpencer E Simon, PA-C      . nicotine (NICODERM CQ - dosed in mg/24 hours) patch 21 mg  21 mg Transdermal Daily Kerry HoughSpencer E Simon, PA-C   21 mg at 02/14/15 0856  . traZODone (DESYREL) tablet 50 mg  50 mg Oral QHS,MR X 1 Spencer E Simon, PA-C   50 mg at 02/13/15 2200   PTA Medications: Prescriptions prior to admission  Medication Sig Dispense Refill Last Dose  . ibuprofen (ADVIL) 200 MG tablet Take 400 mg by mouth every 6 (six) hours as needed for pain.   Past Week at Unknown time  . amoxicillin (AMOXIL) 500 MG capsule 2 po bid with food (Patient not taking: Reported on 02/12/2015) 28 capsule 0 More than a month at Unknown time  . HYDROcodone-acetaminophen (NORCO/VICODIN) 5-325 MG per tablet Take 1 tablet by mouth every 4 (four) hours as needed for pain. (Patient not taking: Reported on 02/12/2015) 20 tablet 0 More than a month at Unknown time  . meloxicam (MOBIC) 7.5 MG tablet 1 po bid with food (Patient not taking: Reported on 02/12/2015) 14 tablet 0 More than a month at Unknown time  . sulfamethoxazole-trimethoprim (SEPTRA DS) 800-160 MG per tablet Take 1 tablet by mouth every 12 (twelve) hours. (Patient not taking: Reported on 02/12/2015) 14 tablet 0 More than a month at Unknown time    Previous Psychotropic Medications: Yes , unknown - for mood sx , years ago  Substance Abuse History in the  last 12 months:  Yes.   cannabis regularly, borrows xanax from his grand pa on and off.   Consequences of Substance Abuse: Medical Consequences:  recent admission Legal Consequences:  was in prison for marijuana posession Family Consequences:  relational struggles  Results for orders placed or performed during the hospital encounter of 02/13/15 (from the past 72 hour(s))  TSH     Status: None   Collection Time: 02/14/15  6:40 AM  Result Value Ref Range   TSH 1.223 0.350 - 4.500 uIU/mL    Comment: [facility]  Hepatic function panel     Status: Abnormal   Collection Time: 02/14/15  6:40 AM  Result Value Ref Range   Total Protein 8.6 (H) 6.0 - 8.3 g/dL   Albumin 5.5 (H) 3.5 - 5.2 g/dL   AST 50 (H) 0 - 37 U/L   ALT 83 (H) 0 - 53 U/L   Alkaline Phosphatase 59 39 - 117 U/L  Total Bilirubin 0.9 0.3 - 1.2 mg/dL   Bilirubin, Direct 0.1 0.0 - 0.5 mg/dL   Indirect Bilirubin 0.8 0.3 - 0.9 mg/dL    Comment: Performed at [facility]  Lipid panel, fasting     Status: Abnormal   Collection Time: 02/14/15  6:40 AM  Result Value Ref Range   Cholesterol 262 (H) 0 - 200 mg/dL   Triglycerides 161 (H) <150 mg/dL   HDL 37 (L) >09 mg/dL   Total CHOL/HDL Ratio 7.1 RATIO   VLDL 30 0 - 40 mg/dL   LDL Cholesterol 604 (H) 0 - 99 mg/dL    Comment:        Total Cholesterol/HDL:CHD Risk Coronary Heart Disease Risk Table                     Men   Women  1/2 Average Risk   3.4   3.3  Average Risk       5.0   4.4  2 X Average Risk   9.6   7.1  3 X Average Risk  23.4   11.0        Use the calculated Patient Ratio above and the CHD Risk Table to determine the patient's CHD Risk.        ATP III CLASSIFICATION (LDL):  <100     mg/dL   Optimal  540-981  mg/dL   Near or Above                    Optimal  130-159  mg/dL   Borderline  191-478  mg/dL   High  >295     mg/dL   Very High Performed at [facility]     Observation  Level/Precautions:  15 minute checks  Laboratory:  TSH   Psychotherapy:  INDIVIDUAL AND GROUP  Medications:  AS NEEDED  Consultations:  SOCIAL WORKER  Discharge Concerns:  STABILITY AND SAFETY       Psychological Evaluations: No   Treatment Plan Summary: Daily contact with patient to assess and evaluate symptoms and progress in treatment and Medication management  Patient will benefit from inpatient treatment and stabilization.  Estimated length of stay is 5-7 days.  Reviewed past medical records,treatment plan. Obtained collateral from grandmother. Will start a small dose of Gabapentin 100 mg po tid for anxiety/aggression. Will continue Trazodone 50 mg po prn for sleep. Will continue to monitor vitals ,medication compliance and treatment side effects while patient is here.  Will monitor for medical issues as well as call consult as needed.  Reviewed labs ,will order as needed.  CSW will start working on disposition. Discussed anger management classes as well as psychotherapy. Patient is agreeable to get help. Patient to participate in therapeutic milieu .       Medical Decision Making:  Review of Psycho-Social Stressors (1), Review or order clinical lab tests (1), Review and summation of old records (2), New Problem, with no additional work-up planned (3), Review of Medication Regimen & Side Effects (2) and Review of New Medication or Change in Dosage (2)  I certify that inpatient services furnished can reasonably be expected to improve the patient's condition.   Aleayah Chico MD 3/3/201611:41 AM

## 2015-02-15 ENCOUNTER — Encounter (HOSPITAL_COMMUNITY): Payer: Self-pay | Admitting: Registered Nurse

## 2015-02-15 LAB — HEMOGLOBIN A1C
Hgb A1c MFr Bld: 5.2 % (ref 4.8–5.6)
Mean Plasma Glucose: 103 mg/dL

## 2015-02-15 MED ORDER — TRAZODONE HCL 50 MG PO TABS: 50.0000 mg | ORAL_TABLET | Freq: Every evening | ORAL | Status: DC | PRN

## 2015-02-15 MED ORDER — GABAPENTIN 100 MG PO CAPS: 100.0000 mg | ORAL_CAPSULE | ORAL | Status: DC

## 2015-02-15 MED ORDER — HYDROXYZINE HCL 25 MG PO TABS: 25.0000 mg | ORAL_TABLET | Freq: Four times a day (QID) | ORAL | Status: DC | PRN

## 2015-02-15 MED ORDER — NICOTINE 21 MG/24HR TD PT24
21.0000 mg | MEDICATED_PATCH | Freq: Every day | TRANSDERMAL | Status: DC
  Filled 2015-02-15 (×3): qty 1

## 2015-02-15 NOTE — Progress Notes (Signed)
D) Pt being discharged to home accompanied by his mother. Pt denies SI and HI, delusions and hallucinations.  A) Pt given encouragement and education on anger management. All medications explained and all Pt's belongings returned to Pt. Pt given support, reassurance and praise. R) Pt plans to follow up with his outpatient plans and to not direct his anger to others.

## 2015-02-15 NOTE — Progress Notes (Signed)
  [facility] :  Will you be returning to the same living situation after discharge:  Yes,  home with grandmother. At discharge, do you have transportation home?: Yes,  [facility] transport coming at 2pm to transport pt back to [facility]-from there, his grandma will pick him up and transport home Do you have the ability to pay for your medications: Yes,  mental health  Release of information consent forms completed and submitted to Medical Records by CSW.  Patient to Follow up at: Follow-up Information    [facility].   Why:  Appt for medication management/assessment for therapy services on this date. Walk in between 12pm-4pm and please bring photo ID. Referral number: [id number]   Contact information:   [address] [place], [place] [address] Phone: [phone] Fax: [phone]      Patient denies SI/HI: Yes,  during group and self report.    Safety Planning and Suicide Prevention discussed: Yes,  SPE completed with pt's grandmother. SPI pamphlet provided to pt and he was encouraged to share information with support network, ask questions, and talk about any concerns relating to SPE> Pt also provided with Anger Management resource list.   Have you used any form of tobacco in the last 30 days? (Cigarettes, Smokeless Tobacco, Cigars, and/or Pipes): Yes  Has patient been referred to the Quitline?: Patient refused referral "I'll quit cold [place]."   Smart, [place] Catherine[place]ather LCSWA  02/15/2015, 10:00 AM
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
## 2015-02-15 NOTE — Plan of Care (Signed)
Problem: Alteration in mood; excessive anxiety as evidenced by: Goal: STG-Pt will report an absence of self-harm thoughts/actions (Patient will report an absence of self-harm thoughts or actions)  Outcome: Progressing Pt denies SI at this time     Problem: Diagnosis: Increased Risk For Suicide Attempt Goal: LTG-Patient Will Report Improvement in Psychotic Symptoms LTG (by discharge) : Patient will report improvement in psychotic symptoms.  Outcome: Progressing Pt denies AVH at this time

## 2015-02-15 NOTE — Progress Notes (Signed)
D: Pt denies SI/HI/AVH. Pt is pleasant and cooperative. Pt stated he "feels good". Pt pacing halls at times, but was appropriate in his behavior.   A: Pt was offered support and encouragement. Pt was given scheduled medications. Pt was encourage to attend groups. Q 15 minute checks were done for safety. Talked to pt to consider his future. Pt Nic patch D/C, pt stated he did not want it.   R:Pt attends groups and interacts well with peers and staff. Pt is taking medication. Pt has no complaints.Pt receptive to treatment and safety maintained on unit. 

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
## 2015-02-19 DIAGNOSIS — R45851 Suicidal ideations: Secondary | ICD-10-CM

## 2015-02-19 NOTE — Progress Notes (Signed)
Patient Discharge Instructions:  After Visit Summary (AVS):   Faxed to:  02/19/15 Discharge Summary Note:   Faxed to:  02/19/15 Psychiatric Admission Assessment Note:   Faxed to:  02/19/15 Suicide Risk Assessment - Discharge Assessment:   Faxed to:  02/19/15 Faxed/Sent to the Next Level Care provider:  02/19/15 [facility] to [facility] @ [phone]  Jerelene ReddenSheena E [place], 02/19/2015, 4:06 PM

## 2015-09-06 ENCOUNTER — Emergency Department (HOSPITAL_COMMUNITY)
Admission: EM | Admit: 2015-09-06 | Discharge: 2015-09-06 | Disposition: A | Discharge status: Home / Self Care | Payer: 59 | Attending: Emergency Medicine | Admitting: Emergency Medicine

## 2015-09-06 ENCOUNTER — Encounter (HOSPITAL_COMMUNITY): Payer: Self-pay

## 2015-09-06 DIAGNOSIS — F329 Major depressive disorder, single episode, unspecified: Secondary | ICD-10-CM | POA: Insufficient documentation

## 2015-09-06 DIAGNOSIS — L0231 Cutaneous abscess of buttock: Secondary | ICD-10-CM | POA: Insufficient documentation

## 2015-09-06 DIAGNOSIS — L0291 Cutaneous abscess, unspecified: Secondary | ICD-10-CM

## 2015-09-06 DIAGNOSIS — Z720 Tobacco use: Secondary | ICD-10-CM | POA: Insufficient documentation

## 2015-09-06 MED ORDER — HYDROCODONE-ACETAMINOPHEN 5-325 MG PO TABS: ORAL_TABLET | ORAL | Status: DC

## 2015-09-06 MED ORDER — DOXYCYCLINE HYCLATE 100 MG PO CAPS: 100.0000 mg | ORAL_CAPSULE | Freq: Two times a day (BID) | ORAL | Status: DC

## 2015-09-06 NOTE — Discharge Instructions (Signed)
Abscess °An abscess (boil or furuncle) is an infected area on or under the skin. This area is filled with yellowish-white fluid (pus) and other material (debris). °HOME CARE  °· Only take medicines as told by your doctor. °· If you were given antibiotic medicine, take it as directed. Finish the medicine even if you start to feel better. °· If gauze is used, follow your doctor's directions for changing the gauze. °· To avoid spreading the infection: °¨ Keep your abscess covered with a bandage. °¨ Wash your hands well. °¨ Do not share personal care items, towels, or whirlpools with others. °¨ Avoid skin contact with others. °· Keep your skin and clothes clean around the abscess. °· Keep all doctor visits as told. °GET HELP RIGHT AWAY IF:  °· You have more pain, puffiness (swelling), or redness in the wound site. °· You have more fluid or blood coming from the wound site. °· You have muscle aches, chills, or you feel sick. °· You have a fever. °MAKE SURE YOU:  °· Understand these instructions. °· Will watch your condition. °· Will get help right away if you are not doing well or get worse. °Document Released: 05/18/2008 Document Revised: 05/31/2012 Document Reviewed: 02/12/2012 °ExitCare® Patient Information ©2015 [employer]. This information is not intended to replace advice given to you by your health care provider. Make sure you discuss any questions you have with your health care provider. ° °

## 2015-09-06 NOTE — ED Notes (Signed)
Pt reports abscess to buttocks x 2 days.

## 2015-09-06 NOTE — ED Provider Notes (Signed)
CSN: [id number]     Arrival date & time 09/06/15  8469 History   None    Chief Complaint  Patient presents with  . Abscess     (Consider location/radiation/quality/duration/timing/severity/associated sxs/prior Treatment) HPI   Brian Combs is a 24 y.o. male who presents to the Emergency Department complaining of painful, swollen area to his buttocks for 2 days.  He complains of pain associated with sitting.  He states this is a recurrent problem and had a similar episode in the same area two years ago that resolved with antibiotics and pain medication.  He has tried warm soaks without relief.  He denies fever, chills, N/V, abdominal pain, spinal tenderness or similar lesions anywhere else.     Past Medical History  Diagnosis Date  . Depression    History reviewed. No pertinent past surgical history. Family History  Problem Relation Age of Onset  . Alcohol abuse Father    Social History  Substance Use Topics  . Smoking status: Current Every Day Smoker -- 1.00 packs/day    Types: Cigarettes  . Smokeless tobacco: Never Used  . Alcohol Use: Yes     Comment: occ    Review of Systems  Constitutional: Negative for fever and chills.  Gastrointestinal: Negative for nausea and vomiting.  Musculoskeletal: Negative for joint swelling and arthralgias.  Skin: Positive for color change.       Abscess   Hematological: Negative for adenopathy.  All other systems reviewed and are negative.     Allergies  Peanut-containing drug products  Home Medications   Prior to Admission medications   Medication Sig Start Date End Date Taking? Authorizing Provider  gabapentin (NEURONTIN) 100 MG capsule Take 1 capsule (100 mg total) by mouth 3 (three) times daily at 8am, 2pm and bedtime. For aggression/anxiety 02/15/15   Shuvon B Rankin, NP  hydrOXYzine (ATARAX/VISTARIL) 25 MG tablet Take 1 tablet (25 mg total) by mouth every 6 (six) hours as needed for anxiety. 02/15/15   Shuvon B Rankin, NP   ibuprofen (ADVIL) 200 MG tablet Take 400 mg by mouth every 6 (six) hours as needed for pain.    Historical Provider, MD  traZODone (DESYREL) 50 MG tablet Take 1 tablet (50 mg total) by mouth at bedtime and may repeat dose one time if needed. For sleep 02/15/15   Shuvon B Rankin, NP   BP 134/76 mmHg  Pulse 93  Temp(Src) 97.9 F (36.6 C) (Oral)  Resp 20  Ht  (1.803 m)  Wt 176 lb (79.833 kg)  BMI 24.56 kg/m2  SpO2 96% Physical Exam  Constitutional: He is oriented to person, place, and time. He appears well-developed and well-nourished. No distress.  HENT:  Head: Normocephalic and atraumatic.  Cardiovascular: Normal rate, regular rhythm and normal heart sounds.   No murmur heard. Pulmonary/Chest: Effort normal and breath sounds normal. No respiratory distress.  Neurological: He is alert and oriented to person, place, and time. He exhibits normal muscle tone. Coordination normal.  Skin: Skin is warm and dry. There is erythema.  1.5 cm area of mild erythema and induration of the gluteal cleft.  No fluctuance or drainage noted.    Nursing note and vitals reviewed.   ED Course  Procedures (including critical care time) Labs Review Labs Reviewed - No data to display  Imaging Review No results found. I have personally reviewed and evaluated these images and lab results as part of my medical decision-making.   EKG Interpretation None  MDM   Final diagnoses:  Abscess    Pt with recurrent small, probably early developing abscess to the right upper gluteal cleft.  Mildly indurated without fluctuance.  Pt prefers to try abx , soaks at this time .  Agrees to return in 2-3 days if sx's worsen.  He is well appearing, non-toxic and appears stable for d/c    Pauline Aus, PA-C 09/06/15 1009  Bethann Berkshire, MD 09/06/15 1432

## 2015-12-23 ENCOUNTER — Emergency Department (HOSPITAL_COMMUNITY)
Admission: EM | Admit: 2015-12-23 | Discharge: 2015-12-23 | Disposition: A | Discharge status: Home / Self Care | Payer: 59 | Attending: Emergency Medicine | Admitting: Emergency Medicine

## 2015-12-23 ENCOUNTER — Encounter (HOSPITAL_COMMUNITY): Payer: Self-pay | Admitting: Emergency Medicine

## 2015-12-23 DIAGNOSIS — Z792 Long term (current) use of antibiotics: Secondary | ICD-10-CM | POA: Insufficient documentation

## 2015-12-23 DIAGNOSIS — Z2089 Contact with and (suspected) exposure to other communicable diseases: Secondary | ICD-10-CM

## 2015-12-23 DIAGNOSIS — Z207 Contact with and (suspected) exposure to pediculosis, acariasis and other infestations: Secondary | ICD-10-CM | POA: Insufficient documentation

## 2015-12-23 DIAGNOSIS — F17210 Nicotine dependence, cigarettes, uncomplicated: Secondary | ICD-10-CM | POA: Insufficient documentation

## 2015-12-23 DIAGNOSIS — F329 Major depressive disorder, single episode, unspecified: Secondary | ICD-10-CM | POA: Insufficient documentation

## 2015-12-23 MED ORDER — TRIAMCINOLONE ACETONIDE 0.5 % EX OINT: 1.0000 "application " | TOPICAL_OINTMENT | Freq: Two times a day (BID) | CUTANEOUS | Status: DC

## 2015-12-23 MED ORDER — PERMETHRIN 5 % EX CREA: TOPICAL_CREAM | CUTANEOUS | Status: DC

## 2015-12-23 MED ORDER — DEXAMETHASONE 4 MG PO TABS: 4.0000 mg | ORAL_TABLET | Freq: Two times a day (BID) | ORAL | Status: DC

## 2015-12-23 NOTE — ED Provider Notes (Signed)
CSN: [id number]     Arrival date & time 12/23/15  0920 History   First MD Initiated Contact with Patient 12/23/15 [phone]     Chief Complaint  Patient presents with  . Insect Bite     (Consider location/radiation/quality/duration/timing/severity/associated sxs/prior Treatment) HPI Comments: Patient states his significant other recently returned from prison. This person received a call from a bunk 8 she had scabies. The patient has noticed a rash on the inner aspect of the right and left thighs, and around the waistband area of the waist. There are also a few on the back. He complains of itching. He's not had any fever or chills. He's not had any recent changes in soaps, clothing, dryer sheets, or detergent. There's been no recent changes in diet.  The history is provided by the patient.    Past Medical History  Diagnosis Date  . Depression    History reviewed. No pertinent past surgical history. Family History  Problem Relation Age of Onset  . Alcohol abuse Father    Social History  Substance Use Topics  . Smoking status: Current Every Day Smoker -- 1.00 packs/day    Types: Cigarettes  . Smokeless tobacco: Never Used  . Alcohol Use: Yes     Comment: occ    Review of Systems  Skin: Positive for rash.  Psychiatric/Behavioral:       Depression      Allergies  Peanut-containing drug products  Home Medications   Prior to Admission medications   Medication Sig Start Date End Date Taking? Authorizing Provider  doxycycline (VIBRAMYCIN) 100 MG capsule Take 1 capsule (100 mg total) by mouth 2 (two) times daily. 09/06/15   Tammy Triplett, PA-C  HYDROcodone-acetaminophen (NORCO/VICODIN) 5-325 MG per tablet Take one tab po q 4-6 hrs prn pain 09/06/15   Tammy Triplett, PA-C   BP 130/75 mmHg  Pulse 104  Temp(Src) 97.5 F (36.4 C) (Oral)  Resp 16  Ht 5\' 11"  (1.803 m)  Wt 83.915 kg  BMI 25.81 kg/m2  SpO2 97% Physical Exam  Constitutional: He is oriented to person, place, and  time. He appears well-developed and well-nourished.  Non-toxic appearance.  HENT:  Head: Normocephalic.  Right Ear: Tympanic membrane and external ear normal.  Left Ear: Tympanic membrane and external ear normal.  Eyes: EOM and lids are normal. Pupils are equal, round, and reactive to light.  Neck: Normal range of motion. Neck supple. Carotid bruit is not present.  Cardiovascular: Normal rate, regular rhythm, normal heart sounds, intact distal pulses and normal pulses.   Pulmonary/Chest: Breath sounds normal. No respiratory distress.  Abdominal: Soft. Bowel sounds are normal. There is no tenderness. There is no guarding.  Musculoskeletal: Normal range of motion.  Lymphadenopathy:       Head (right side): No submandibular adenopathy present.       Head (left side): No submandibular adenopathy present.    He has no cervical adenopathy.  Neurological: He is alert and oriented to person, place, and time. He has normal strength. No cranial nerve deficit or sensory deficit.  Skin: Skin is warm and dry.  There is a red papular rash noted of the inner aspect of the right and left thighs, also in the waistband area at the waist. There are a few on the lower back. No red streaks appreciated. No drainage noted.  Psychiatric: He has a normal mood and affect. His speech is normal.  Nursing note and vitals reviewed.   ED Course  Procedures (including critical care  time) Labs Review Labs Reviewed - No data to display  Imaging Review No results found. I have personally reviewed and evaluated these images and lab results as part of my medical decision-making.   EKG Interpretation None      MDM Vital signs reviewed. No evidence for acute issue or anaphylaxis. Pt reports exposure to scabies.  Plan: Elimite, triamcinolone, Decadron. Discussed need to change linen daily and vacuum all carpet and cloth covered couches.   Final diagnoses:  Scabies exposure    **I have reviewed nursing notes,  vital signs, and all appropriate lab and imaging results for this patient.Brian Combs*    Ikesha Siller, PA-C 12/26/15 [phone]  Blane OharaJoshua Zavitz, MD 12/28/15 [phone]

## 2015-12-23 NOTE — ED Notes (Signed)
Pt reports bumps all over body x3 weeks. Pt girlfriend's bunkmate had scabies and pt believes it has passed to him.

## 2016-09-10 ENCOUNTER — Encounter (HOSPITAL_COMMUNITY): Payer: Self-pay | Admitting: Emergency Medicine

## 2016-09-10 ENCOUNTER — Emergency Department (HOSPITAL_COMMUNITY)
Admission: EM | Admit: 2016-09-10 | Discharge: 2016-09-10 | Disposition: A | Discharge status: Home / Self Care | Payer: 59 | Attending: Emergency Medicine | Admitting: Emergency Medicine

## 2016-09-10 DIAGNOSIS — N4889 Other specified disorders of penis: Secondary | ICD-10-CM

## 2016-09-10 DIAGNOSIS — F17210 Nicotine dependence, cigarettes, uncomplicated: Secondary | ICD-10-CM | POA: Insufficient documentation

## 2016-09-10 DIAGNOSIS — Z79899 Other long term (current) drug therapy: Secondary | ICD-10-CM | POA: Insufficient documentation

## 2016-09-10 LAB — URINALYSIS, ROUTINE W REFLEX MICROSCOPIC
Bilirubin Urine: NEGATIVE
Glucose, UA: NEGATIVE mg/dL
Hgb urine dipstick: NEGATIVE
NITRITE: NEGATIVE
PH: 6 (ref 5.0–8.0)
Protein, ur: NEGATIVE mg/dL
Specific Gravity, Urine: <1.005 — ABNORMAL LOW (ref 1.005–1.030)

## 2016-09-10 LAB — URINE MICROSCOPIC-ADD ON

## 2016-09-10 MED ORDER — PERMETHRIN 5 % EX CREA: TOPICAL_CREAM | CUTANEOUS | 1 refills | Status: DC

## 2016-09-10 NOTE — ED Provider Notes (Signed)
[facility]-EMERGENCY DEPT Provider Note   CSN: [id number] Arrival date & time: 09/10/16  0865  By signing my name below, I, Sonum Patel, attest that this documentation has been prepared under the direction and in the presence of Marily Memos, MD. Electronically Signed: Sonum Patel, Neurosurgeon. 09/10/16. 10:59 AM.  History   Chief Complaint Chief Complaint  Patient presents with  . Groin Swelling    The history is provided by the patient. No language interpreter was used.     HPI Comments: Brian Combs is a 25 y.o. male who presents to the Emergency Department complaining of 3 days of constant, gradually worsened penile pain and swelling that began after a strain during sexual intercourse. He has applied ice without relief. He denies dysuria.    Past Medical History:  Diagnosis Date  . Depression     Patient Active Problem List   Diagnosis Date Noted  . Suicidal ideation 02/19/2015  . Cannabis use disorder, moderate, dependence (HCC) 02/14/2015  . Mild benzodiazepine use disorder 02/14/2015  . Substance-induced anxiety disorder (HCC) 02/14/2015    History reviewed. No pertinent surgical history.     Home Medications    Prior to Admission medications   Medication Sig Start Date End Date Taking? Authorizing Provider  hydrocortisone cream 1 % Apply 1 application topically 2 (two) times daily.   Yes Historical Provider, MD  OVER THE COUNTER MEDICATION 1 application 2 (two) times daily. Cream for eczema   Yes Historical Provider, MD  permethrin (ELIMITE) 5 % cream Apply to affected area once Apply cream from the neck to the soles of the feet for 8 hours, then shower off. May repeat in one week if needed. 09/10/16   Marily Memos, MD    Family History Family History  Problem Relation Age of Onset  . Alcohol abuse Father     Social History Social History  Substance Use Topics  . Smoking status: Current Every Day Smoker    Packs/day: 1.00    Types: Cigarettes  . Smokeless  tobacco: Never Used  . Alcohol use Yes     Comment: occ     Allergies   Peanut-containing drug products   Review of Systems Review of Systems  Genitourinary: Positive for penile pain and penile swelling. Negative for dysuria.  All other systems reviewed and are negative.  Physical Exam Updated Vital Signs BP 109/60   Pulse 88   Temp 98.1 F (36.7 C) (Oral)   Resp 18   Ht 5\' 11"  (1.803 m)   Wt 165 lb (74.8 kg)   SpO2 99%   BMI 23.01 kg/m   Physical Exam  Constitutional: He is oriented to person, place, and time. He appears well-developed and well-nourished.  HENT:  Head: Normocephalic and atraumatic.  Eyes: Pupils are equal, round, and reactive to light.  Cardiovascular: Normal rate.   Pulmonary/Chest: Effort normal.  Abdominal: Soft. He exhibits no distension.  Genitourinary: Circumcised.  Musculoskeletal: Normal range of motion. He exhibits no edema or deformity.  Neurological: He is alert and oriented to person, place, and time.  Skin: Skin is warm and dry.  Psychiatric: He has a normal mood and affect.  Nursing note and vitals reviewed.        ED Treatments / Results  DIAGNOSTIC STUDIES: Oxygen Saturation is 96% on RA, adequate by my interpretation.    COORDINATION OF CARE: 10:59 AM Discussed treatment plan with pt at bedside and pt agreed to plan.  11:17 AM Discussed case with urology who  recommends wrapping with some pressure to help with drainage.     Labs (all labs ordered are listed, but only abnormal results are displayed) Labs Reviewed  URINALYSIS, ROUTINE W REFLEX MICROSCOPIC (NOT AT [facility]) - Abnormal; Notable for the following:       Result Value   Specific Gravity, Urine <1.005 (*)    Ketones, ur TRACE (*)    Leukocytes, UA TRACE (*)    All other components within normal limits  URINE MICROSCOPIC-ADD ON - Abnormal; Notable for the following:    Squamous Epithelial / LPF 0-5 (*)    Bacteria, UA RARE (*)    All other components within  normal limits  GC/CHLAMYDIA PROBE AMP ([place]) NOT AT [facility]    EKG  EKG Interpretation None       Radiology No results found.  Procedures Procedures (including critical care time)  Medications Ordered in ED Medications - No data to display   Initial Impression / Assessment and Plan / ED Course  I have reviewed the triage vital signs and the nursing notes.  Pertinent labs & imaging results that were available during my care of the patient were reviewed by me and considered in my medical decision making (see chart for details).  Clinical Course    Swelling of penis after intercourse. No e/o corporal fracture. Able to urinate normally. D/w urologist with above recommendations. Plan for dc with urology follow up if needed and not improving.   Final Clinical Impressions(s) / ED Diagnoses   Final diagnoses:  Edema of penis    New Prescriptions Discharge Medication List as of 09/10/2016  1:47 PM     I personally performed the services described in this documentation, which was scribed in my presence. The recorded information has been reviewed and is accurate.    Marily MemosJason Aarion Metzgar, MD 09/10/16 1626

## 2016-09-10 NOTE — Discharge Instructions (Signed)
Your penile swelling is likely from disrupted lymphatic drainage. Please continue to put some pressure on it to help aid in drainage. If It does not seem to improve in 2-3 days please follow-up with urology for further conditions. At any point it becomes bright red or any  fevers or difficulty urinating please return to the emergency department immediately.

## 2016-09-10 NOTE — ED Triage Notes (Signed)
Pt here for multiple complaints. Pt c/o swelling to tip of penis x 3 days. States that it occurred after having intercourse. Denies injury or urinary symptoms. Pt also reports itchy rash to bilateral upper and lower extremities.

## 2016-09-11 LAB — GC/CHLAMYDIA PROBE AMP (~~LOC~~) NOT AT ARMC
Chlamydia: NEGATIVE
Neisseria Gonorrhea: NEGATIVE

## 2018-12-10 ENCOUNTER — Emergency Department (HOSPITAL_COMMUNITY): Payer: Self-pay

## 2018-12-10 ENCOUNTER — Other Ambulatory Visit: Payer: Self-pay

## 2018-12-10 ENCOUNTER — Encounter (HOSPITAL_COMMUNITY): Payer: Self-pay

## 2018-12-10 ENCOUNTER — Emergency Department (HOSPITAL_COMMUNITY)
Admission: EM | Admit: 2018-12-10 | Discharge: 2018-12-10 | Disposition: A | Discharge status: Home / Self Care | Payer: Self-pay | Attending: Emergency Medicine | Admitting: Emergency Medicine

## 2018-12-10 DIAGNOSIS — J189 Pneumonia, unspecified organism: Secondary | ICD-10-CM | POA: Insufficient documentation

## 2018-12-10 DIAGNOSIS — R6889 Other general symptoms and signs: Secondary | ICD-10-CM

## 2018-12-10 DIAGNOSIS — J111 Influenza due to unidentified influenza virus with other respiratory manifestations: Secondary | ICD-10-CM | POA: Insufficient documentation

## 2018-12-10 DIAGNOSIS — J181 Lobar pneumonia, unspecified organism: Secondary | ICD-10-CM

## 2018-12-10 LAB — CBC WITH DIFFERENTIAL/PLATELET
ABS IMMATURE GRANULOCYTES: 0.23 10*3/uL — AB (ref 0.00–0.07)
Basophils Absolute: 0.1 10*3/uL (ref 0.0–0.1)
Basophils Relative: 0 %
EOS PCT: 0 %
Eosinophils Absolute: 0 10*3/uL (ref 0.0–0.5)
HCT: 44.2 % (ref 39.0–52.0)
HEMOGLOBIN: 15.7 g/dL (ref 13.0–17.0)
Immature Granulocytes: 1 %
LYMPHS PCT: 5 %
Lymphs Abs: 1.3 10*3/uL (ref 0.7–4.0)
MCH: 30.9 pg (ref 26.0–34.0)
MCHC: 35.5 g/dL (ref 30.0–36.0)
MCV: 87 fL (ref 80.0–100.0)
MONO ABS: 1.3 10*3/uL — AB (ref 0.1–1.0)
MONOS PCT: 5 %
NEUTROS ABS: 21.6 10*3/uL — AB (ref 1.7–7.7)
Neutrophils Relative %: 89 %
Platelets: 143 10*3/uL — ABNORMAL LOW (ref 150–400)
RBC: 5.08 MIL/uL (ref 4.22–5.81)
RDW: 12 % (ref 11.5–15.5)
WBC: 24.5 10*3/uL — AB (ref 4.0–10.5)
nRBC: 0 % (ref 0.0–0.2)

## 2018-12-10 LAB — BASIC METABOLIC PANEL
Anion gap: 12 (ref 5–15)
BUN: 12 mg/dL (ref 6–20)
CO2: 23 mmol/L (ref 22–32)
CREATININE: 0.85 mg/dL (ref 0.61–1.24)
Calcium: 9.1 mg/dL (ref 8.9–10.3)
Chloride: 101 mmol/L (ref 98–111)
GFR calc Af Amer: >60 mL/min (ref >60)
Glucose, Bld: 134 mg/dL — ABNORMAL HIGH (ref 70–99)
POTASSIUM: 3.2 mmol/L — AB (ref 3.5–5.1)
SODIUM: 136 mmol/L (ref 135–145)

## 2018-12-10 MED ORDER — ONDANSETRON HCL 4 MG/2ML IJ SOLN
4.0000 mg | Freq: Once | INTRAMUSCULAR | Status: AC
  Administered 2018-12-10: 4 mg via INTRAVENOUS
  Filled 2018-12-10: qty 2

## 2018-12-10 MED ORDER — SODIUM CHLORIDE 0.9 % IV SOLN
INTRAVENOUS | Status: DC
  Administered 2018-12-10: 08:00:00 via INTRAVENOUS

## 2018-12-10 MED ORDER — SODIUM CHLORIDE 0.9 % IV BOLUS
2000.0000 mL | Freq: Once | INTRAVENOUS | Status: AC
  Administered 2018-12-10: 2000 mL via INTRAVENOUS

## 2018-12-10 MED ORDER — AZITHROMYCIN 250 MG PO TABS: 250.0000 mg | ORAL_TABLET | Freq: Every day | ORAL | 0 refills | Status: DC

## 2018-12-10 MED ORDER — DM-GUAIFENESIN ER 30-600 MG PO TB12: 1.0000 | ORAL_TABLET | Freq: Two times a day (BID) | ORAL | 1 refills | Status: DC

## 2018-12-10 MED ORDER — SODIUM CHLORIDE 0.9 % IV SOLN
1.0000 g | Freq: Once | INTRAVENOUS | Status: AC
  Administered 2018-12-10: 1 g via INTRAVENOUS
  Filled 2018-12-10: qty 10

## 2018-12-10 MED ORDER — ONDANSETRON 4 MG PO TBDP: 4.0000 mg | ORAL_TABLET | Freq: Three times a day (TID) | ORAL | 1 refills | Status: DC | PRN

## 2018-12-10 NOTE — ED Triage Notes (Signed)
Pt reports he has been vomiting intermittently for 2 days.  He cant keep anything down He states his left lung hurts and lower back hurts as well

## 2018-12-10 NOTE — ED Provider Notes (Signed)
[facility] EMERGENCY DEPARTMENT Provider Note   CSN: [id number] Arrival date & time: 12/10/18  0707     History   Chief Complaint Chief Complaint  Patient presents with  . Emesis    HPI Brian Combs is a 27 y.o. male.  Patient with flulike symptoms since Tuesday that would make it 5 days worth of symptoms.  2 days ago started having frequent vomiting and some diarrhea only a few times a day.  Last vomiting episode was about an hour ago.  Associated with some discomfort in the lungs mostly on the left side as well as myalgias.  No fevers.  No blood in the vomit.     Past Medical History:  Diagnosis Date  . Depression     Patient Active Problem List   Diagnosis Date Noted  . Suicidal ideation 02/19/2015  . Cannabis use disorder, moderate, dependence (HCC) 02/14/2015  . Mild benzodiazepine use disorder (HCC) 02/14/2015  . Substance-induced anxiety disorder (HCC) 02/14/2015    History reviewed. No pertinent surgical history.      Home Medications    Prior to Admission medications   Medication Sig Start Date End Date Taking? Authorizing Provider  azithromycin (ZITHROMAX) 250 MG tablet Take 1 tablet (250 mg total) by mouth daily. Take first 2 tablets together, then 1 every day until finished. 12/10/18   Vanetta MuldersZackowski, Tayron Hunnell, MD  dextromethorphan-[facility]aiFENesin [facility](MUCINEX DM) 30-600 MG 12hr tablet Take 1 tablet by mouth 2 (two) times daily. 12/10/18   Vanetta MuldersZackowski, Denice Cardon, MD  ondansetron (ZOFRAN ODT) 4 MG disintegrating tablet Take 1 tablet (4 mg total) by mouth every 8 (eight) hours as needed. 12/10/18   Vanetta MuldersZackowski, Lateya Dauria, MD  gabapentin (NEURONTIN) 100 MG capsule Take 1 capsule (100 mg total) by mouth 3 (three) times daily at 8am, 2pm and bedtime. For aggression/anxiety Patient not taking: Reported on 09/06/2015 02/15/15 09/06/15  Rankin, Shuvon B, NP  traZODone (DESYREL) 50 MG tablet Take 1 tablet (50 mg total) by mouth at bedtime and may repeat dose one time if needed. For  sleep Patient not taking: Reported on 09/06/2015 02/15/15 09/06/15  Rankin, Denice BorsShuvon B, NP    Family History Family History  Problem Relation Age of Onset  . Alcohol abuse Father     Social History Social History   Tobacco Use  . Smoking status: Former Smoker    Packs/day: 1.00    Types: Cigarettes  . Smokeless tobacco: Never Used  Substance Use Topics  . Alcohol use: Yes    Comment: occ  . Drug use: Not Currently    Types: Marijuana, Benzodiazepines    Comment: uds positive for marijuana     Allergies   Peanut-containing drug products   Review of Systems Review of Systems  Constitutional: Negative for fever.  HENT: Positive for congestion. Negative for sore throat.   Eyes: Negative for redness.  Respiratory: Positive for cough. Negative for shortness of breath.   Cardiovascular: Negative for chest pain.  Gastrointestinal: Positive for diarrhea and nausea. Negative for abdominal pain.  Genitourinary: Negative for dysuria.  Musculoskeletal: Positive for myalgias.  Skin: Negative for rash.  Neurological: Negative for headaches.  Hematological: Does not bruise/bleed easily.  Psychiatric/Behavioral: Negative for confusion.     Physical Exam Updated Vital Signs BP 112/86 (BP Location: Right Arm)   Pulse (!) 109   Temp 98.6 F (37 C) (Oral)   Resp 16   Ht 1.803 m (5\' 11" )   Wt 84.4 kg   SpO2 95%   BMI 25.94  kg/m   Physical Exam Vitals signs and nursing note reviewed.  Constitutional:      Appearance: Normal appearance.  HENT:     Head: Normocephalic and atraumatic.     Mouth/Throat:     Mouth: Mucous membranes are dry.  Eyes:     Extraocular Movements: Extraocular movements intact.     Conjunctiva/sclera: Conjunctivae normal.     Pupils: Pupils are equal, round, and reactive to light.  Neck:     Musculoskeletal: Normal range of motion and neck supple.  Cardiovascular:     Rate and Rhythm: Regular rhythm. Tachycardia present.  Pulmonary:     Effort:  Pulmonary effort is normal.     Breath sounds: Normal breath sounds.  Abdominal:     General: Bowel sounds are normal.     Palpations: Abdomen is soft.     Tenderness: There is no abdominal tenderness.  Musculoskeletal: Normal range of motion.  Skin:    Findings: No rash.  Neurological:     General: No focal deficit present.     Mental Status: He is alert and oriented to person, place, and time.      ED Treatments / Results  Labs (all labs ordered are listed, but only abnormal results are displayed) Labs Reviewed  BASIC METABOLIC PANEL - Abnormal; Notable for the following components:      Result Value   Potassium 3.2 (*)    Glucose, Bld 134 (*)    All other components within normal limits  CBC WITH DIFFERENTIAL/PLATELET - Abnormal; Notable for the following components:   WBC 24.5 (*)    Platelets 143 (*)    Neutro Abs 21.6 (*)    Monocytes Absolute 1.3 (*)    Abs Immature Granulocytes 0.23 (*)    All other components within normal limits    EKG None  Radiology Dg Chest 2 View  Result Date: 12/10/2018 CLINICAL DATA:  Left-sided chest pain. Vomiting. EXAM: CHEST - 2 VIEW COMPARISON:  09/21/2011 FINDINGS: There is a focal area of abnormal density overlying the midthoracic spine on the lateral view. On the PA view there is slight fullness of the inferior aspect of the left hilum and an ill-defined density seen behind the left side of the heart. Lungs are otherwise clear. No effusions. Heart size and vascularity are normal. No bone abnormality. IMPRESSION: Focal area of abnormal density overlying the midthoracic spine on the lateral view. I recommend CT scan of the chest without contrast for further evaluation. Electronically Signed   By: Francene BoyersJames  Maxwell M.D.   On: 12/10/2018 08:11   Ct Chest Wo Contrast  Result Date: 12/10/2018 CLINICAL DATA:  Per ED notes: Pt reports he has been vomiting intermittently for 2 days. He cant keep anything down He states his left lung hurts and  lower back hurts as well. CT demonstrated density in the posterior left lung EXAM: CT CHEST WITHOUT CONTRAST TECHNIQUE: Multidetector CT imaging of the chest was performed following the standard protocol without IV contrast. COMPARISON:  Radiographs from earlier the same day FINDINGS: Cardiovascular: Heart size normal. No pericardial effusion. No thoracic aortic aneurysm. Mediastinum/Nodes: No definite hilar or mediastinal adenopathy, sensitivity decreased without IV contrast. Lungs/Pleura: Airspace consolidation with air bronchograms medially in the left lower lobe corresponding to density on chest radiography. Right lung clear. No pleural effusion. No pneumothorax. Upper Abdomen: No acute findings. Splenomegaly. Musculoskeletal: No chest wall mass or suspicious bone lesions identified. IMPRESSION: 1. Left lower lobe pneumonia. 2. Splenomegaly. Electronically Signed   By:  Corlis Leak M.D.   On: 12/10/2018 09:29    Procedures Procedures (including critical care time)  Medications Ordered in ED Medications  0.9 %  sodium chloride infusion ( Intravenous New Bag/Given 12/10/18 0801)  cefTRIAXone (ROCEPHIN) 1 g in sodium chloride 0.9 % 100 mL IVPB (has no administration in time range)  sodium chloride 0.9 % bolus 2,000 mL (2,000 mLs Intravenous New Bag/Given 12/10/18 0800)  ondansetron (ZOFRAN) injection 4 mg (4 mg Intravenous Given 12/10/18 0800)  ondansetron (ZOFRAN) injection 4 mg (4 mg Intravenous Given 12/10/18 0916)     Initial Impression / Assessment and Plan / ED Course  I have reviewed the triage vital signs and the nursing notes.  Pertinent labs & imaging results that were available during my care of the patient were reviewed by me and considered in my medical decision making (see chart for details).    Patient with flulike illness for the past 5 days.  2 days ago started with vomiting frequently.  Also not getting any better with his cough and congestion seems to be worse.  Also having  some diarrhea.  Chest x-ray raise some concerns for abnormality CT chest confirmed left lower lobe pneumonia.  Patient symptoms early on may have been influenza but he is out of the window for Tamiflu.  Patient will receive 2 L of fluid here to hydrate him.  Patient will receive 1 g of Rocephin as per community-acquired pneumonia protocol and then will be discharged taking Zithromax for the next 5 days.  Work note provided.  Patient also given Zofran for the vomiting to take at home as well as Mucinex DM.  Patient nontoxic no acute distress.  Oxygen sats on room air 95%.   Final Clinical Impressions(s) / ED Diagnoses   Final diagnoses:  Flu-like symptoms  Community acquired pneumonia of left lower lobe of lung [facility])    ED Discharge Orders         Ordered    ondansetron (ZOFRAN ODT) 4 MG disintegrating tablet  Every 8 hours PRN     12/10/18 0944    azithromycin (ZITHROMAX) 250 MG tablet  Daily     12/10/18 0944    dextromethorphan-guaiFENesin (MUCINEX DM) 30-600 MG 12hr tablet  2 times daily     12/10/18 0945           Vanetta Mulders, MD 12/10/18 1005

## 2018-12-10 NOTE — Discharge Instructions (Addendum)
CT of the chest consistent with left lower lobe pneumonia.  Take the antibiotic Zithromax as directed for the next few days.  Work note provided.  Would expect improvement over the next 2 days.  Return for any new or worse symptoms.  Also have Zofran for the nausea.  Continue to hydrate yourself well.  Take the Mucinex DM for the cough and congestion as well.

## 2018-12-10 NOTE — ED Notes (Signed)
Patient transported to X-ray 

## 2020-01-16 ENCOUNTER — Ambulatory Visit: Payer: Self-pay | Admitting: Physician Assistant

## 2020-01-31 ENCOUNTER — Ambulatory Visit: Payer: Self-pay | Admitting: Physician Assistant

## 2020-03-12 ENCOUNTER — Ambulatory Visit: Payer: Self-pay | Attending: Internal Medicine

## 2020-03-12 ENCOUNTER — Other Ambulatory Visit: Payer: Self-pay

## 2020-03-12 DIAGNOSIS — Z20822 Contact with and (suspected) exposure to covid-19: Secondary | ICD-10-CM

## 2020-03-13 LAB — SARS-COV-2, NAA 2 DAY TAT

## 2020-03-13 LAB — NOVEL CORONAVIRUS, NAA: SARS-CoV-2, NAA: NOT DETECTED

## 2020-05-22 IMAGING — CT CT CHEST W/O CM
2 of 3 series · 15 of 36 positions shown, 18 images · non-contrast
Comparison: Radiographs from earlier the same day

CLINICAL DATA: Per ED notes: Pt reports he has been vomiting
intermittently for 2 days. He cant keep anything down He states his
left lung hurts and lower back hurts as well. CT demonstrated
density in the posterior left lung

EXAM:
CT CHEST WITHOUT CONTRAST
TECHNIQUE: Multidetector CT imaging of the chest was performed following the
standard protocol without IV contrast.

[Series 2: thorax · axial · 0.76mm/px · z∈[+1245,+1517]mm · 12 of 160 slices shown, 15 images]
[im 12/160  mediastinal]
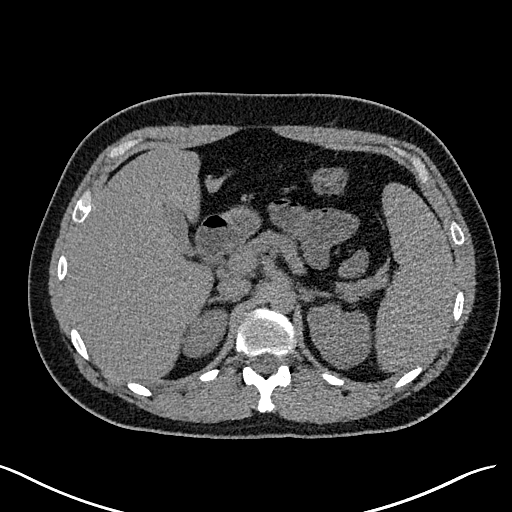
[im 12/160  lung]
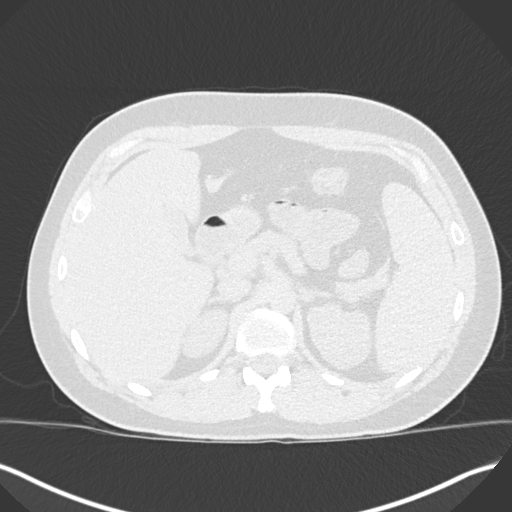
[im 24/160  lung]
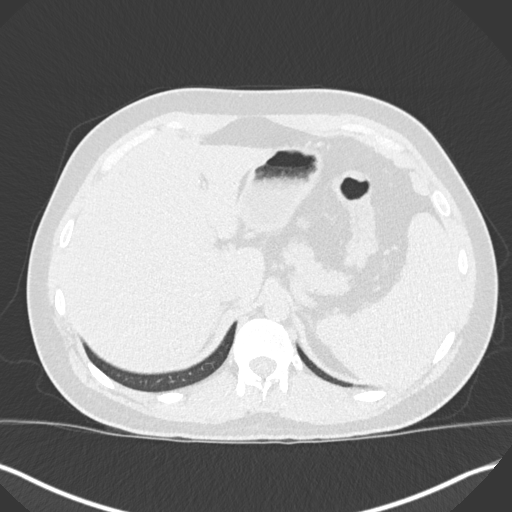
[im 36/160  lung]
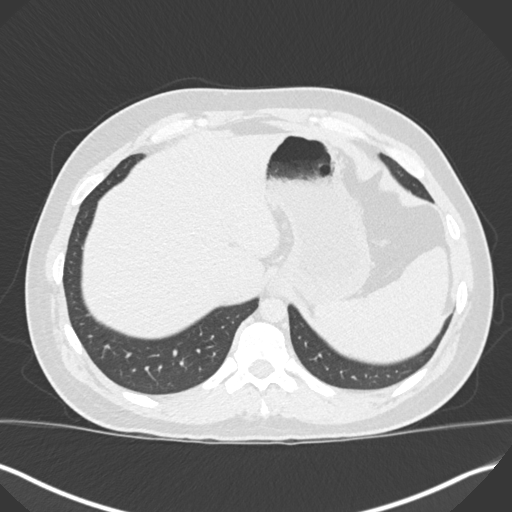
[im 48/160  lung]
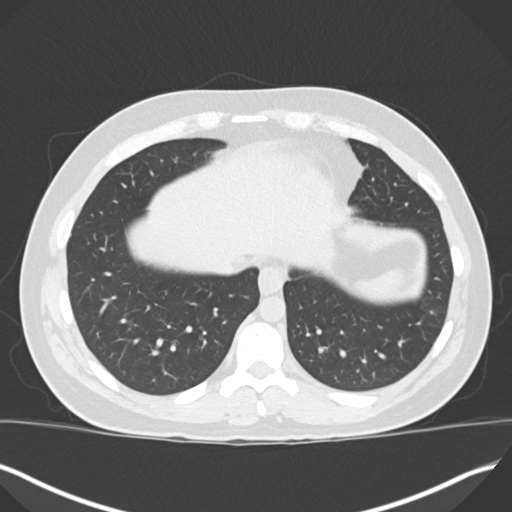
[im 59/160  mediastinal]
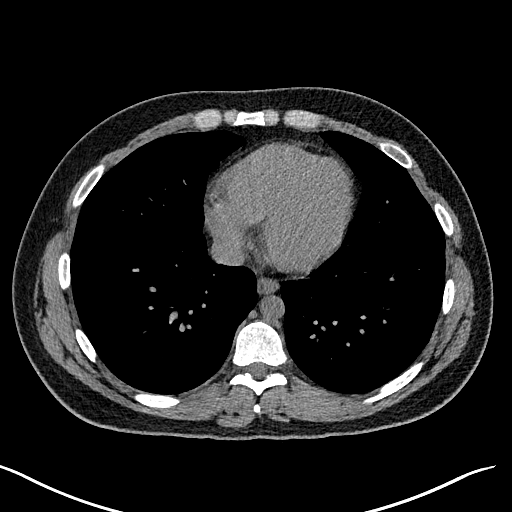
[im 59/160  lung]
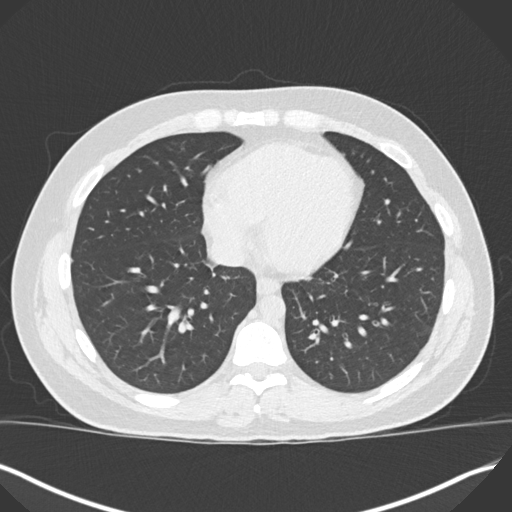
[im 71/160  lung]
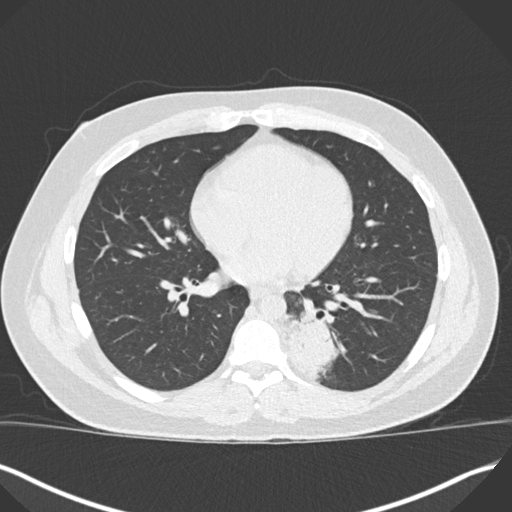
[im 89/160  lung]
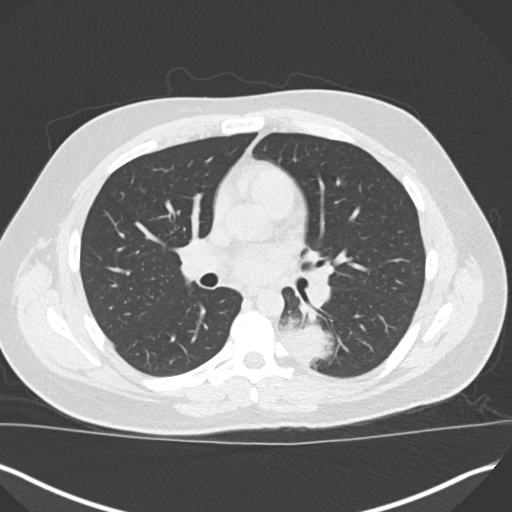
[im 101/160  lung]
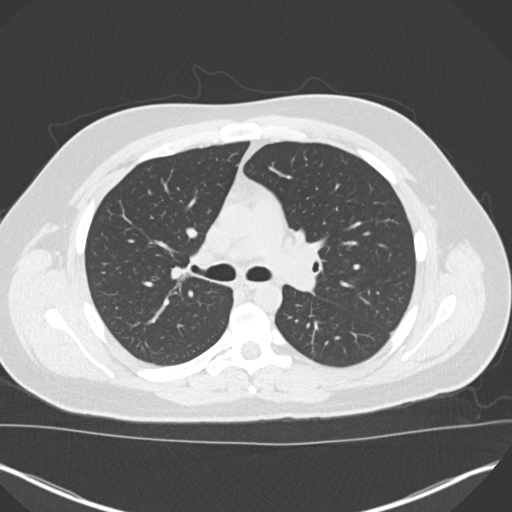
[im 112/160  mediastinal]
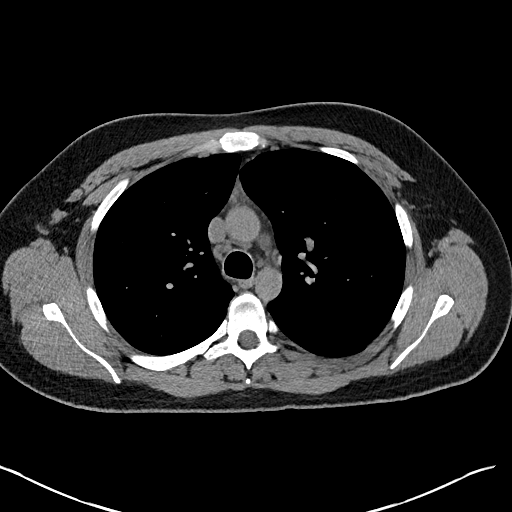
[im 112/160  lung]
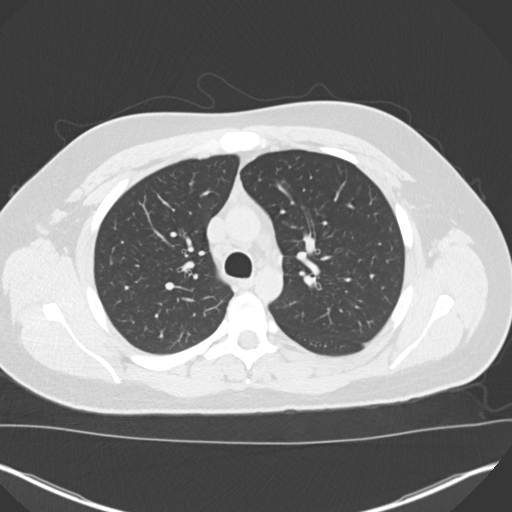
[im 124/160  lung]
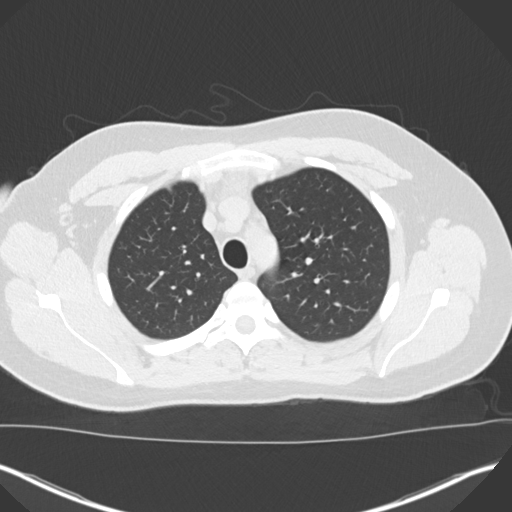
[im 136/160  lung]
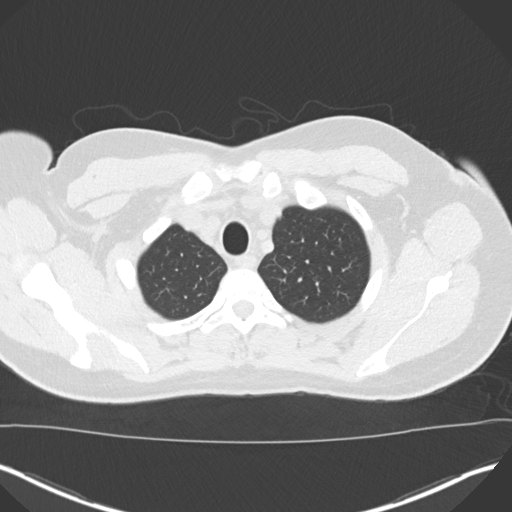
[im 148/160  lung]
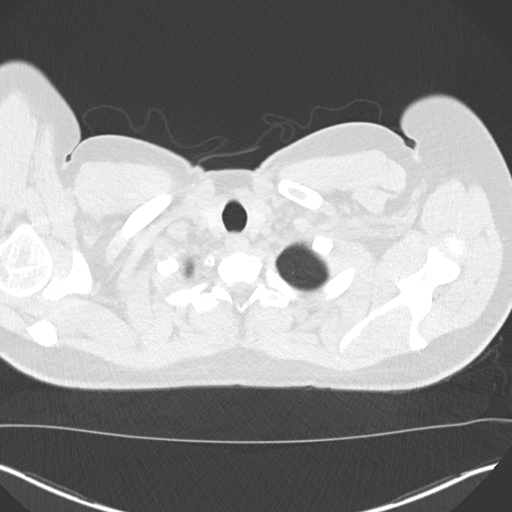

[Series 5: coronal · coronal · 0.64mm/px · 3 of 132 slices shown]
[im 27/132  lung]
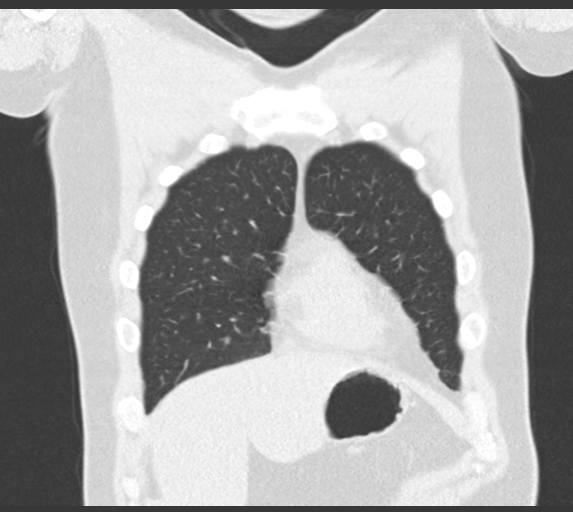
[im 53/132  lung]
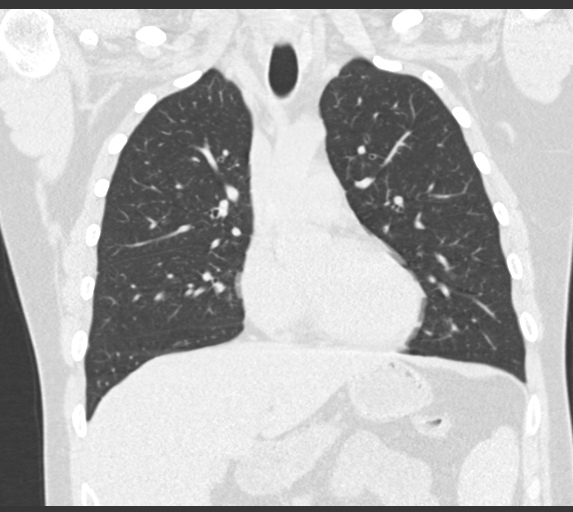
[im 79/132  lung]
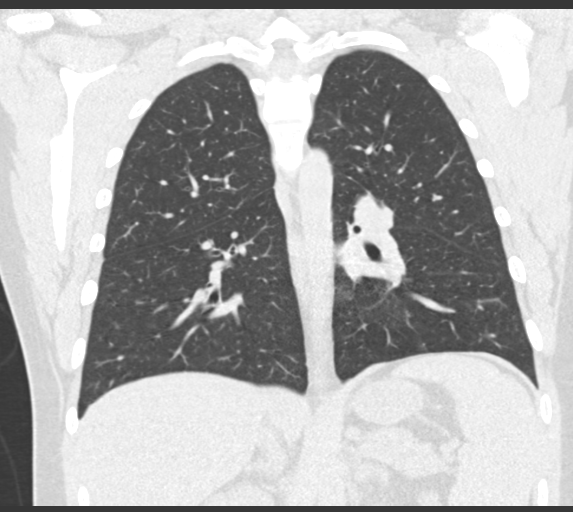

[15 of 36 positions shown; findings below may reference images not displayed]

FINDINGS: Cardiovascular: Heart size normal. No pericardial effusion. No
thoracic aortic aneurysm.

Mediastinum/Nodes: No definite hilar or mediastinal adenopathy,
sensitivity decreased without IV contrast.

Lungs/Pleura: Airspace consolidation with air bronchograms medially
in the left lower lobe corresponding to density on chest
radiography. Right lung clear. No pleural effusion. No pneumothorax.

Upper Abdomen: No acute findings. Splenomegaly.

Musculoskeletal: No chest wall mass or suspicious bone lesions
identified.
IMPRESSION: 1. Left lower lobe pneumonia.
2. Splenomegaly.

## 2020-05-22 IMAGING — DX DG CHEST 2V
2 series · 2 of 2 positions shown · non-contrast
Comparison: 09/21/2011

CLINICAL DATA: Left-sided chest pain. Vomiting.

EXAM:
CHEST - 2 VIEW

[chest pa]
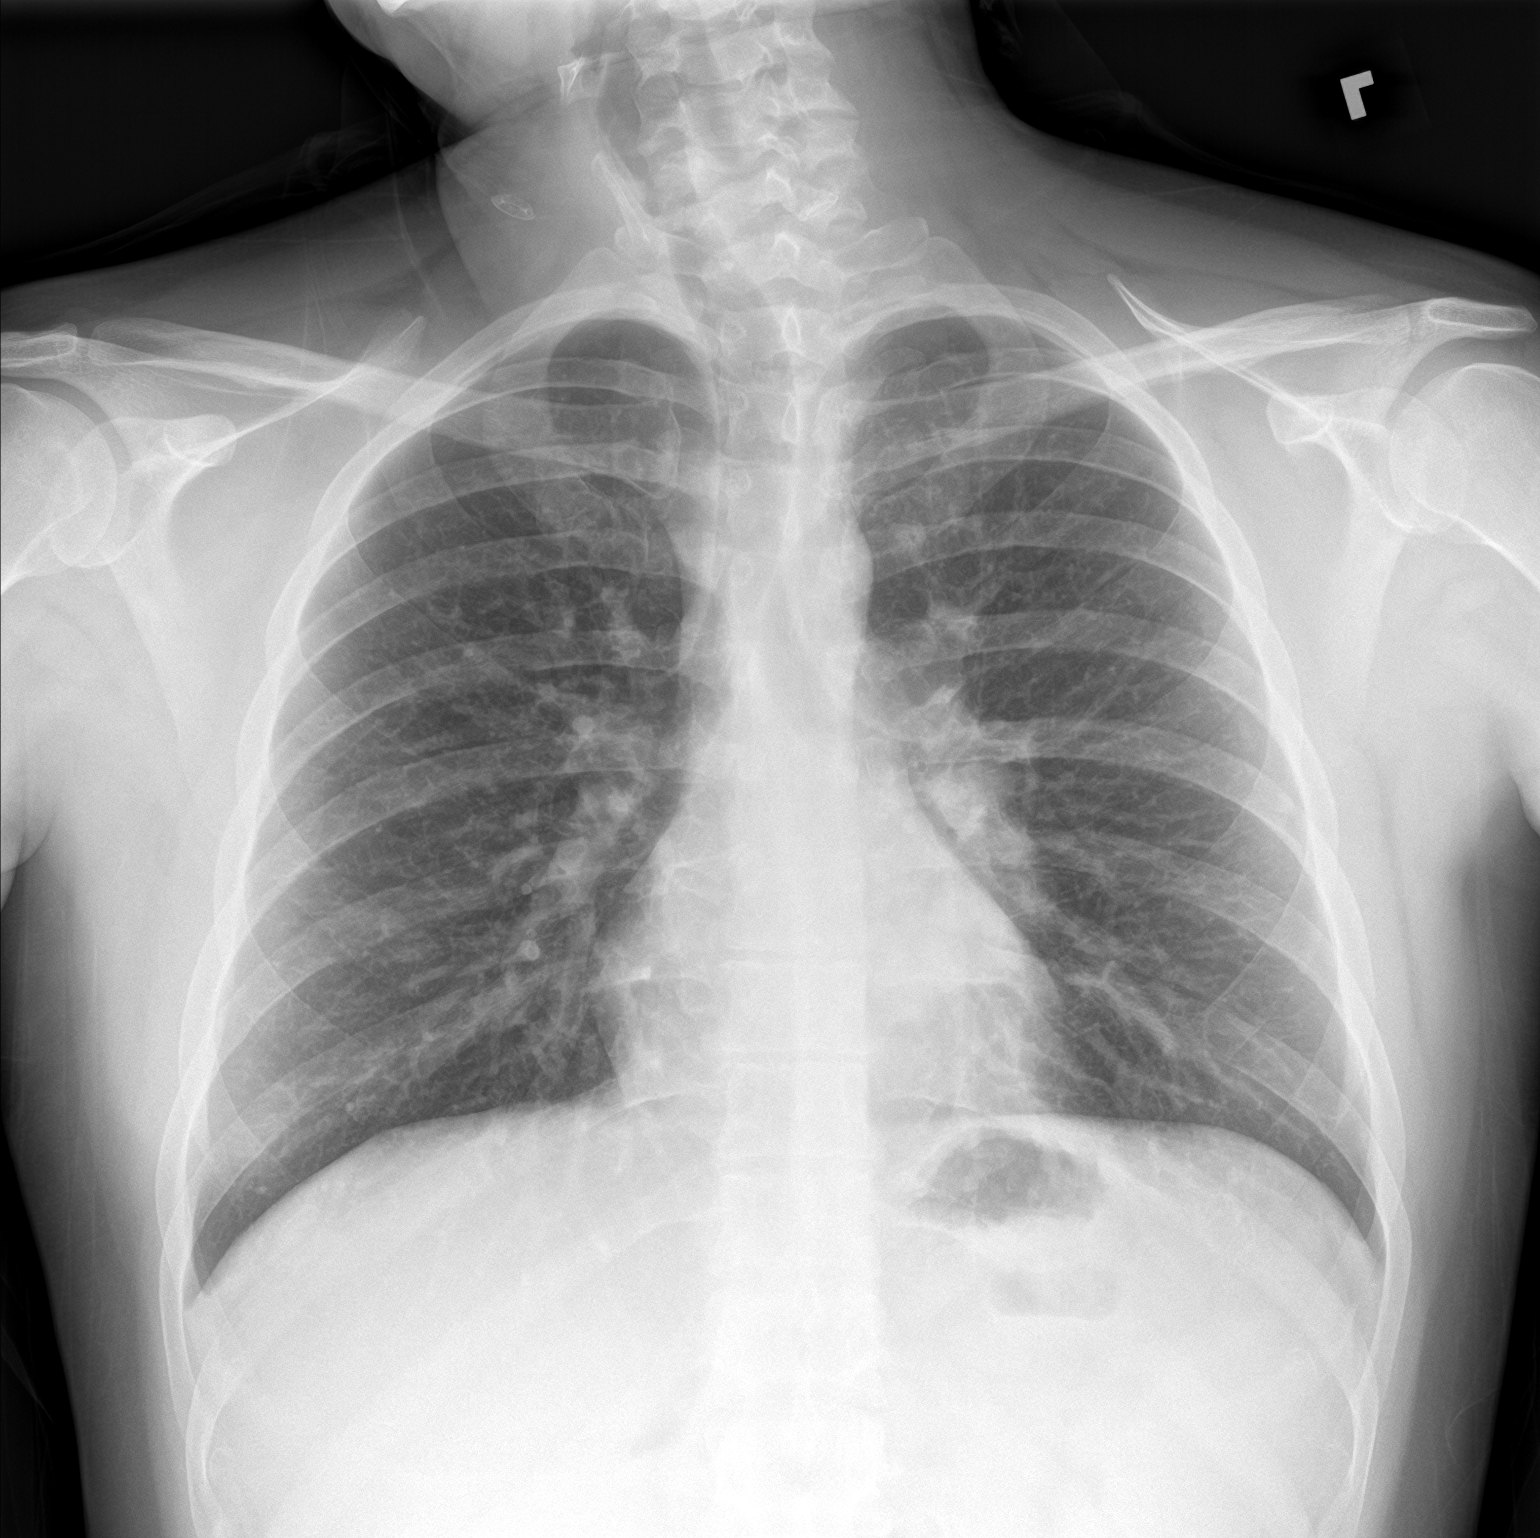

[chest lat]
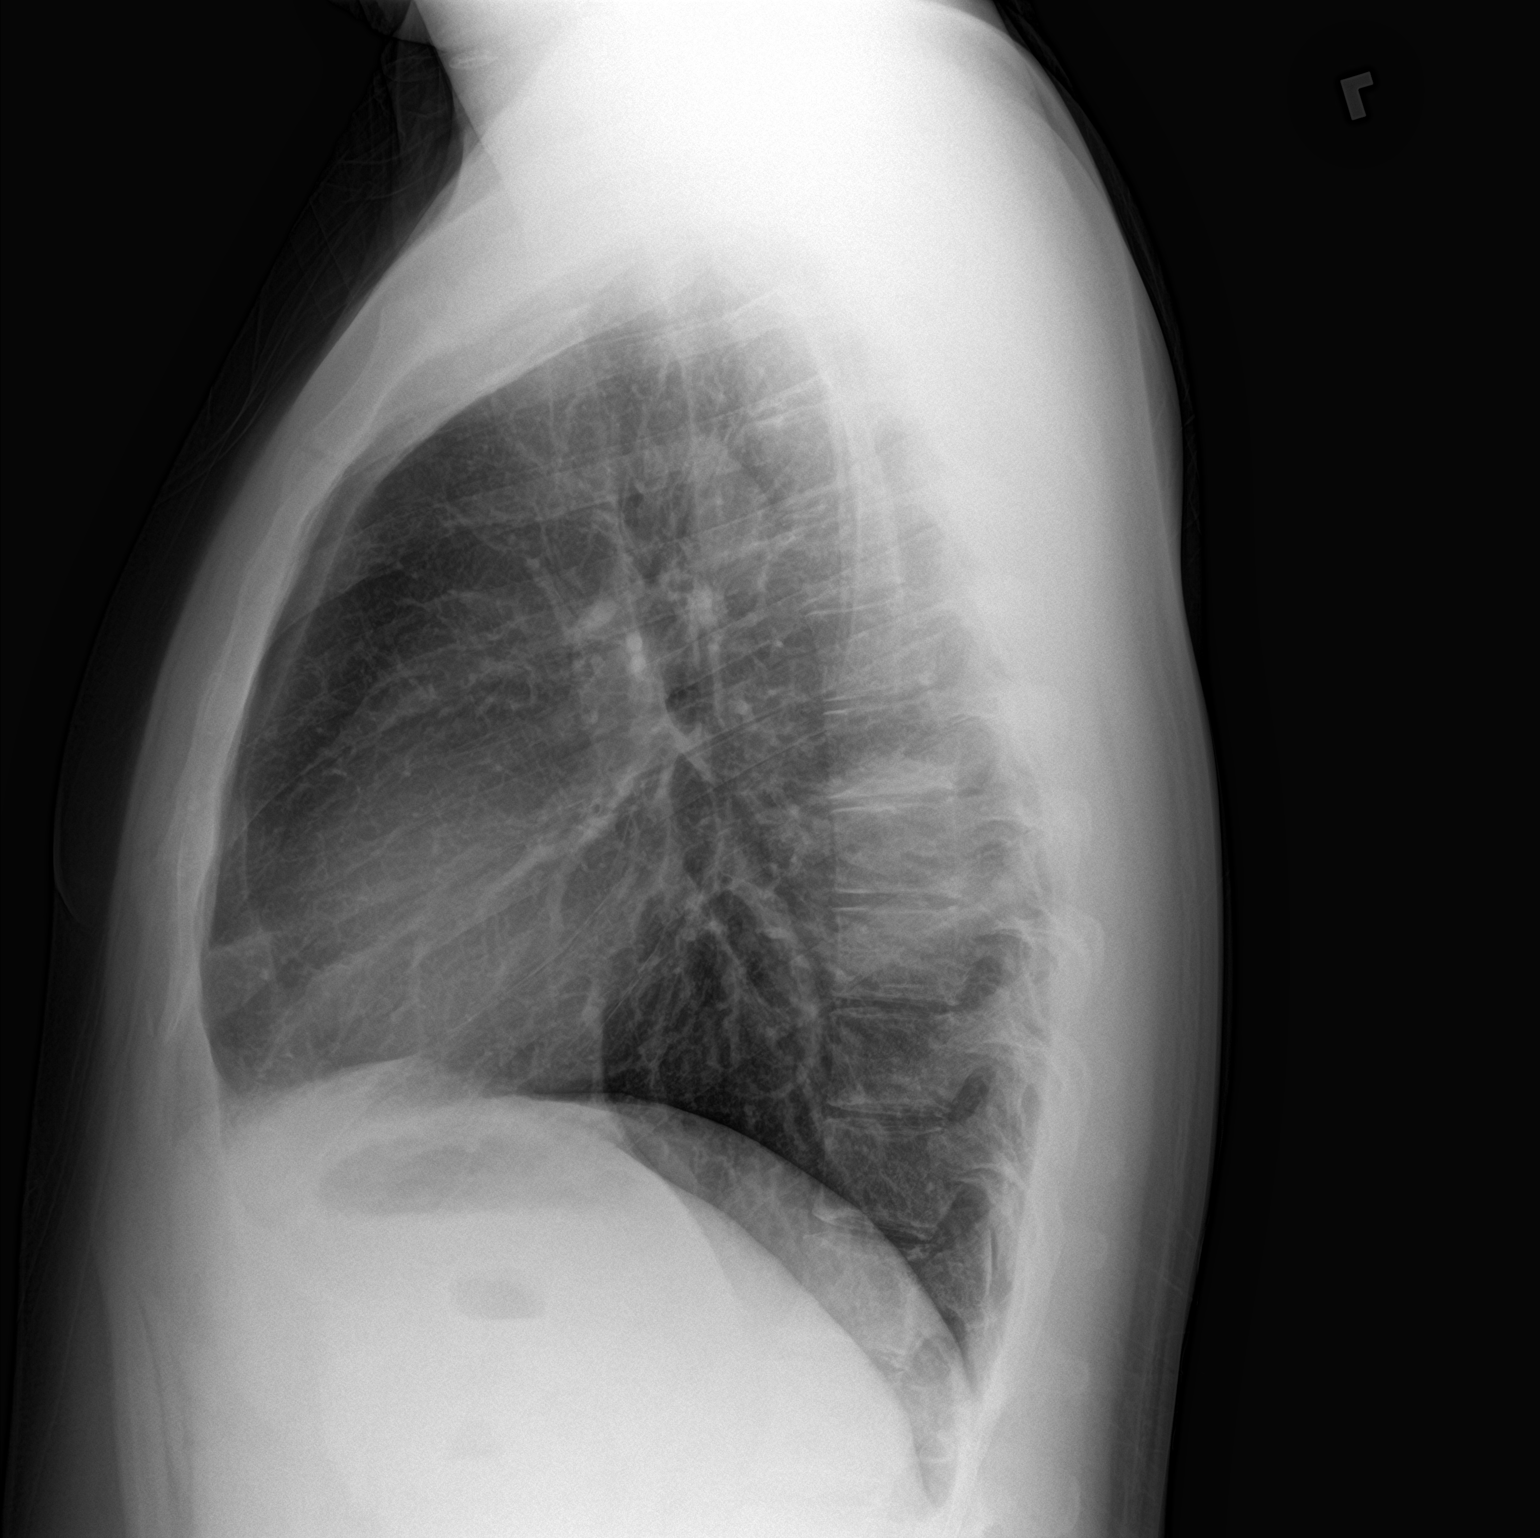

[2 of 2 positions shown; findings below may reference images not displayed]

FINDINGS: There is a focal area of abnormal density overlying the midthoracic
spine on the lateral view. On the PA view there is slight fullness
of the inferior aspect of the left hilum and an ill-defined density
seen behind the left side of the heart. Lungs are otherwise clear.
No effusions. Heart size and vascularity are normal. No bone
abnormality.
IMPRESSION: Focal area of abnormal density overlying the midthoracic spine on
the lateral view. I recommend CT scan of the chest without contrast
for further evaluation.

## 2020-11-24 ENCOUNTER — Other Ambulatory Visit: Payer: Self-pay

## 2020-11-24 ENCOUNTER — Emergency Department (HOSPITAL_COMMUNITY)
Admission: EM | Admit: 2020-11-24 | Discharge: 2020-11-24 | Disposition: A | Discharge status: Home / Self Care | Payer: Self-pay | Attending: Emergency Medicine | Admitting: Emergency Medicine

## 2020-11-24 ENCOUNTER — Encounter (HOSPITAL_COMMUNITY): Payer: Self-pay | Admitting: Emergency Medicine

## 2020-11-24 DIAGNOSIS — Z87891 Personal history of nicotine dependence: Secondary | ICD-10-CM | POA: Insufficient documentation

## 2020-11-24 DIAGNOSIS — K0889 Other specified disorders of teeth and supporting structures: Secondary | ICD-10-CM | POA: Insufficient documentation

## 2020-11-24 DIAGNOSIS — Z91010 Allergy to peanuts: Secondary | ICD-10-CM | POA: Insufficient documentation

## 2020-11-24 MED ORDER — HYDROCODONE-ACETAMINOPHEN 5-325 MG PO TABS: 2.0000 | ORAL_TABLET | ORAL | 0 refills | Status: DC | PRN

## 2020-11-24 MED ORDER — HYDROCODONE-ACETAMINOPHEN 5-325 MG PO TABS: 1.0000 | ORAL_TABLET | ORAL | 0 refills | Status: DC | PRN

## 2020-11-24 MED ORDER — AMOXICILLIN 500 MG PO CAPS: 500.0000 mg | ORAL_CAPSULE | Freq: Three times a day (TID) | ORAL | 0 refills | Status: DC

## 2020-11-24 NOTE — ED Triage Notes (Signed)
Patient c/o right lower dental pain. Per patient intermittent pain x2 months. Patient states treated x2 with antibiotics in which the pain will subside but return shortly after. Denies any fevers. Patient taking ibuprofen with no relief-last taken yesterday.

## 2020-11-24 NOTE — Discharge Instructions (Signed)
We are prescribing an antibiotic, and a few narcotic pain pills; to help your discomfort.  It is very important to follow-up with a dentist as soon as possible.  It appears that you may need to have most of your teeth extracted.  Do not drive or drink alcohol when you are taking the narcotic pain reliever.  Try using over-the-counter pain relievers as follows: Acetaminophen 650 mg every 4 hours for pain, or ibuprofen 400 mg 3 times a day with meals for pain.

## 2020-11-24 NOTE — ED Provider Notes (Signed)
[facility] EMERGENCY DEPARTMENT Provider Note   CSN: [id number] Arrival date & time: 11/24/20  0744     History Chief Complaint  Patient presents with  . Dental Pain    Brian Combs is a 29 y.o. male.  HPI He presents for evaluation of pain and swelling of his right lower mandible area that he suspects is from a dental source.  He states he is scheduled an appointment [facility] the [facility] for [facility], but it is not for another month or so.  He denies fever, chills, nausea, vomiting, weakness or dizziness.  He has not had any other recent dental care.  There are no other known modifying factors.    Past Medical History:  Diagnosis Date  . Depression     Patient Active Problem List   Diagnosis Date Noted  . Suicidal ideation 02/19/2015  . Cannabis use disorder, moderate, dependence (HCC) 02/14/2015  . Mild benzodiazepine use disorder (HCC) 02/14/2015  . Substance-induced anxiety disorder (HCC) 02/14/2015    History reviewed. No pertinent surgical history.     Family History  Problem Relation Age of Onset  . Alcohol abuse Father     Social History   Tobacco Use  . Smoking status: Former Smoker    Packs/day: 1.00    Types: Cigarettes  . Smokeless tobacco: Never Used  Vaping Use  . Vaping Use: Never used  Substance Use Topics  . Alcohol use: Yes    Comment: occ  . Drug use: Yes    Types: Marijuana, Benzodiazepines    Home Medications Prior to Admission medications   Medication Sig Start Date End Date Taking? Authorizing Provider  azithromycin (ZITHROMAX) 250 MG tablet Take 1 tablet (250 mg total) by mouth daily. Take first 2 tablets together, then 1 every day until finished. 12/10/18   Vanetta Mulders, MD  dextromethorphan-guaiFENesin [facility] DM) 30-600 MG 12hr tablet Take 1 tablet by mouth 2 (two) times daily. 12/10/18   Vanetta Mulders, MD  ondansetron (ZOFRAN ODT) 4 MG disintegrating tablet Take 1 tablet (4 mg total) by mouth every 8  (eight) hours as needed. 12/10/18   Vanetta Mulders, MD    Allergies    Peanut-containing drug products  Review of Systems   Review of Systems  All other systems reviewed and are negative.   Physical Exam Updated Vital Signs BP (!) 155/95 (BP Location: Left Arm)   Pulse (!) 105   Temp 97.6 F (36.4 C) (Oral)   Resp 16   Ht 5\' 11"  (1.803 m)   Wt 83.9 kg   SpO2 98%   BMI 25.80 kg/m   Physical Exam Vitals and nursing note reviewed.  Constitutional:      Appearance: He is well-developed and well-nourished.  HENT:     Head: Normocephalic and atraumatic.     Right Ear: External ear normal.     Left Ear: External ear normal.     Mouth/Throat:     Comments: All of his teeth have marked dental decay, many eroded to the base.  Right lower molar region with mild inflammatory process around the teeth, without fluctuance.  There is no trismus.  There is no sign of deep tissue infection. There is no visualized or palpable abscess of the oropharynx. Eyes:     Extraocular Movements: EOM normal.     Conjunctiva/sclera: Conjunctivae normal.     Pupils: Pupils are equal, round, and reactive to light.  Neck:     Trachea: Phonation normal.  Cardiovascular:  Rate and Rhythm: Normal rate.  Pulmonary:     Effort: Pulmonary effort is normal.  Chest:     Chest wall: No bony tenderness.  Abdominal:     Palpations: Abdomen is soft.     Tenderness: There is no abdominal tenderness.  Musculoskeletal:        General: Normal range of motion.     Cervical back: Normal range of motion and neck supple.  Skin:    General: Skin is warm, dry and intact.  Neurological:     Mental Status: He is alert and oriented to person, place, and time.     Cranial Nerves: No cranial nerve deficit.     Sensory: No sensory deficit.     Motor: No abnormal muscle tone.     Coordination: Coordination normal.  Psychiatric:        Mood and Affect: Mood and affect and mood normal.        Behavior: Behavior  normal.        Thought Content: Thought content normal.        Judgment: Judgment normal.     ED Results / Procedures / Treatments   Labs (all labs ordered are listed, but only abnormal results are displayed) Labs Reviewed - No data to display  EKG None  Radiology No results found.  Procedures Procedures (including critical care time)  Medications Ordered in ED Medications - No data to display  ED Course  I have reviewed the triage vital signs and the nursing notes.  Pertinent labs & imaging results that were available during my care of the patient were reviewed by me and considered in my medical decision making (see chart for details).    MDM Rules/Calculators/A&P                           Patient Vitals for the past 24 hrs:  BP Temp Temp src Pulse Resp SpO2 Height Weight  11/24/20 0752 -- -- -- -- -- -- 5\' 11"  (1.803 m) 83.9 kg  11/24/20 0751 (!) 155/95 97.6 F (36.4 C) Oral (!) 105 16 98 % -- --    8:42 AM Reevaluation with update and discussion. After initial assessment and treatment, an updated evaluation reveals no change in clinical status. 14/12/21   Medical Decision Making:  This patient is presenting for evaluation of dental pain, which does not require a range of treatment options, and is not a complaint that involves a high risk of morbidity and mortality. The differential diagnoses include abscess, deep tissue infection, dental decay. I decided to review old records, and in summary patient presenting with extensive dental decay, and more recent onset of pain with some swelling of the right lower molar region..  I did not require additional historical information from anyone.    Critical Interventions-clinical evaluation, discussion with patient  After These Interventions, the Patient was reevaluated and was found stable for discharge.  He has extensive dental decay and appears to need a complete dental extraction process.  He may have an early  infection right lower mandible region.  He will be treated with antibiotics and a few days worth of narcotic pain relievers.  He is strongly encouraged to follow-up for dental care as soon as possible.  CRITICAL CARE-no Performed by: Mancel Bale  Nursing Notes Reviewed/ Care Coordinated Applicable Imaging Reviewed Interpretation of Laboratory Data incorporated into ED treatment  The patient appears reasonably screened and/or stabilized for discharge and  I doubt any other medical condition or other [facility] requiring further screening, evaluation, or treatment in the ED at this time prior to discharge.  Plan: Home Medications-OTC analgesia of choice; Home Treatments-dental care ASAP; return here if the recommended treatment, does not improve the symptoms; Recommended follow up-dentistry     Final Clinical Impression(s) / ED Diagnoses Final diagnoses:  None    Rx / DC Orders ED Discharge Orders    None       Mancel Bale, MD 11/24/20 [phone]

## 2021-03-17 ENCOUNTER — Encounter (HOSPITAL_COMMUNITY): Payer: Self-pay

## 2021-03-17 ENCOUNTER — Other Ambulatory Visit: Payer: Self-pay

## 2021-03-17 ENCOUNTER — Emergency Department (HOSPITAL_COMMUNITY)
Admission: EM | Admit: 2021-03-17 | Discharge: 2021-03-17 | Disposition: A | Discharge status: Home / Self Care | Payer: Self-pay | Attending: Emergency Medicine | Admitting: Emergency Medicine

## 2021-03-17 DIAGNOSIS — Z87891 Personal history of nicotine dependence: Secondary | ICD-10-CM | POA: Insufficient documentation

## 2021-03-17 DIAGNOSIS — L0201 Cutaneous abscess of face: Secondary | ICD-10-CM | POA: Insufficient documentation

## 2021-03-17 DIAGNOSIS — L0291 Cutaneous abscess, unspecified: Secondary | ICD-10-CM

## 2021-03-17 DIAGNOSIS — Z91010 Allergy to peanuts: Secondary | ICD-10-CM | POA: Insufficient documentation

## 2021-03-17 MED ORDER — IBUPROFEN 800 MG PO TABS: 800.0000 mg | ORAL_TABLET | Freq: Three times a day (TID) | ORAL | 0 refills | Status: DC | PRN

## 2021-03-17 MED ORDER — DOXYCYCLINE HYCLATE 100 MG PO CAPS: 100.0000 mg | ORAL_CAPSULE | Freq: Two times a day (BID) | ORAL | 0 refills | Status: DC

## 2021-03-17 NOTE — ED Provider Notes (Signed)
[facility] EMERGENCY DEPARTMENT Provider Note   CSN: [id number] Arrival date & time: 03/17/21  3354     History Chief Complaint  Patient presents with  . Abscess    Brian Combs is a 30 y.o. male.  Patient complains of swelling to left side of his cheek.  He taken a couple antibiotics he had that helped  The history is provided by the patient and medical records. No language interpreter was used.  Abscess Abscess location: Left cheek. Abscess quality: induration   Red streaking: no   Progression:  Improving Chronicity:  New Context: not diabetes   Associated symptoms: no fatigue and no headaches        Past Medical History:  Diagnosis Date  . Depression     Patient Active Problem List   Diagnosis Date Noted  . Suicidal ideation 02/19/2015  . Cannabis use disorder, moderate, dependence (HCC) 02/14/2015  . Mild benzodiazepine use disorder (HCC) 02/14/2015  . Substance-induced anxiety disorder (HCC) 02/14/2015    History reviewed. No pertinent surgical history.     Family History  Problem Relation Age of Onset  . Alcohol abuse Father     Social History   Tobacco Use  . Smoking status: Former Smoker    Packs/day: 1.00    Types: Cigarettes  . Smokeless tobacco: Never Used  Vaping Use  . Vaping Use: Never used  Substance Use Topics  . Alcohol use: Yes    Comment: occ  . Drug use: Yes    Types: Marijuana, Benzodiazepines    Home Medications Prior to Admission medications   Medication Sig Start Date End Date Taking? Authorizing Provider  doxycycline (VIBRAMYCIN) 100 MG capsule Take 1 capsule (100 mg total) by mouth 2 (two) times daily. One po bid x 7 days 03/17/21  Yes Bethann Berkshire, MD  ibuprofen (ADVIL) 800 MG tablet Take 1 tablet (800 mg total) by mouth every 8 (eight) hours as needed for moderate pain. 03/17/21  Yes Bethann Berkshire, MD  amoxicillin (AMOXIL) 500 MG capsule Take 1 capsule (500 mg total) by mouth 3 (three) times daily. 11/24/20    Mancel Bale, MD  azithromycin (ZITHROMAX) 250 MG tablet Take 1 tablet (250 mg total) by mouth daily. Take first 2 tablets together, then 1 every day until finished. 12/10/18   Vanetta Mulders, MD  dextromethorphan-guaiFENesin [facility] DM) 30-600 MG 12hr tablet Take 1 tablet by mouth 2 (two) times daily. 12/10/18   Vanetta Mulders, MD  HYDROcodone-acetaminophen (NORCO/VICODIN) 5-325 MG tablet Take 1 tablet by mouth every 4 (four) hours as needed for moderate pain or severe pain. 11/24/20   Mancel Bale, MD  ondansetron (ZOFRAN ODT) 4 MG disintegrating tablet Take 1 tablet (4 mg total) by mouth every 8 (eight) hours as needed. 12/10/18   Vanetta Mulders, MD    Allergies    Peanut-containing drug products  Review of Systems   Review of Systems  Constitutional: Negative for appetite change and fatigue.  HENT: Negative for congestion, ear discharge and sinus pressure.        Swelling to left cheek  Eyes: Negative for discharge.  Respiratory: Negative for cough.   Cardiovascular: Negative for chest pain.  Gastrointestinal: Negative for abdominal pain and diarrhea.  Genitourinary: Negative for frequency and hematuria.  Musculoskeletal: Negative for back pain.  Skin: Negative for rash.  Neurological: Negative for seizures and headaches.  Psychiatric/Behavioral: Negative for hallucinations.    Physical Exam Updated Vital Signs BP 129/89 (BP Location: Right Arm)   Pulse Marland Kitchen)  113   Temp 98.6 F (37 C) (Oral)   Resp 16   Ht 5\' 11"  (1.803 m)   Wt 81.6 kg   SpO2 100%   BMI 25.10 kg/m   Physical Exam Vitals reviewed.  Constitutional:      Appearance: He is well-developed.  HENT:     Head: Normocephalic.     Nose: Nose normal.     Mouth/Throat:     Comments: Patient has a healing abscess to his left cheek Eyes:     General: No scleral icterus.    Conjunctiva/sclera: Conjunctivae normal.  Neck:     Thyroid: No thyromegaly.  Cardiovascular:     Rate and Rhythm: Normal rate  and regular rhythm.     Heart sounds: No murmur heard. No friction rub. No gallop.   Pulmonary:     Breath sounds: No stridor. No wheezing or rales.  Chest:     Chest wall: No tenderness.  Abdominal:     General: There is no distension.     Tenderness: There is no abdominal tenderness. There is no rebound.  Musculoskeletal:        General: Normal range of motion.     Cervical back: Neck supple.  Lymphadenopathy:     Cervical: No cervical adenopathy.  Skin:    Findings: No erythema or rash.  Neurological:     Mental Status: He is alert and oriented to person, place, and time.     Motor: No abnormal muscle tone.     Coordination: Coordination normal.  Psychiatric:        Behavior: Behavior normal.     ED Results / Procedures / Treatments   Labs (all labs ordered are listed, but only abnormal results are displayed) Labs Reviewed - No data to display  EKG None  Radiology No results found.  Procedures Procedures   Medications Ordered in ED Medications - No data to display  ED Course  I have reviewed the triage vital signs and the nursing notes.  Pertinent labs & imaging results that were available during my care of the patient were reviewed by me and considered in my medical decision making (see chart for details).    MDM Rules/Calculators/A&P                          Patient abscess to left cheek that is improving.  He is placed on doxycycline and referred to ENT for possible removal of a cyst there Final Clinical Impression(s) / ED Diagnoses Final diagnoses:  Abscess    Rx / DC Orders ED Discharge Orders         Ordered    doxycycline (VIBRAMYCIN) 100 MG capsule  2 times daily        03/17/21 0837    ibuprofen (ADVIL) 800 MG tablet  Every 8 hours PRN        03/17/21 0837           05/17/21, MD 03/17/21 [phone]

## 2021-03-17 NOTE — ED Triage Notes (Signed)
Pt with abscess to left cheek, states came up 1 month ago. Pt denies fever.

## 2021-03-17 NOTE — Discharge Instructions (Addendum)
Make an appointment to follow-up with Dr. Elijah Birk next week for your swelling on your cheek.  Keep your appointment with your dentist

## 2021-06-21 ENCOUNTER — Emergency Department (HOSPITAL_COMMUNITY)
Admission: EM | Admit: 2021-06-21 | Discharge: 2021-06-21 | Disposition: A | Discharge status: Home / Self Care | Payer: Self-pay | Attending: Emergency Medicine | Admitting: Emergency Medicine

## 2021-06-21 ENCOUNTER — Encounter (HOSPITAL_COMMUNITY): Payer: Self-pay

## 2021-06-21 ENCOUNTER — Other Ambulatory Visit: Payer: Self-pay

## 2021-06-21 DIAGNOSIS — Z87891 Personal history of nicotine dependence: Secondary | ICD-10-CM | POA: Insufficient documentation

## 2021-06-21 DIAGNOSIS — R220 Localized swelling, mass and lump, head: Secondary | ICD-10-CM

## 2021-06-21 DIAGNOSIS — K047 Periapical abscess without sinus: Secondary | ICD-10-CM | POA: Insufficient documentation

## 2021-06-21 DIAGNOSIS — Z91010 Allergy to peanuts: Secondary | ICD-10-CM | POA: Insufficient documentation

## 2021-06-21 MED ORDER — IBUPROFEN 600 MG PO TABS: 600.0000 mg | ORAL_TABLET | Freq: Four times a day (QID) | ORAL | 0 refills | Status: AC | PRN

## 2021-06-21 MED ORDER — HYDROCODONE-ACETAMINOPHEN 5-325 MG PO TABS: 1.0000 | ORAL_TABLET | Freq: Four times a day (QID) | ORAL | 0 refills | Status: DC | PRN

## 2021-06-21 MED ORDER — AMOXICILLIN 500 MG PO CAPS: 500.0000 mg | ORAL_CAPSULE | Freq: Three times a day (TID) | ORAL | 0 refills | Status: DC

## 2021-06-21 NOTE — ED Provider Notes (Signed)
[facility] EMERGENCY DEPARTMENT Provider Note   CSN: [id number] Arrival date & time: 06/21/21  9528     History Chief Complaint  Patient presents with   Facial Swelling    Brian Combs is a 30 y.o. male with past medical history as listed below.  HPI Patient presents to emergency department today with chief complaint of left-sided facial swelling x1 week.  He states this happened before on the right side of his face.  He saw dentist several months ago and it was recommended that he have multiple teeth pulled.  Patient states he had an appointment scheduled with an oral surgeon last month however his daughter was born so he had to reschedule the appointment.  It is now scheduled for sometime next month.  Patient is describing throbbing pain localized to his left lower jaw.  He rates pain 7 out of 10 in severity.  He has been using Orajel and taking ibuprofen at home without much symptom improvement.  Patient denies any dental injury or trauma, states he just has bad teeth that need to be removed.  Patient states he is still able to tolerate p.o. intake, usually eats soft foods anyway because he has constant mild dental pain. Denies fever, chills, voice change, inability to control secretions, nausea/vomiting, dysphagia, odynophagia, drainage or trauma.     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 No pertinent surgical history.     Family History  Problem Relation Age of Onset   Alcohol abuse Father     Social History   Tobacco Use   Smoking status: Former    Packs/day: 1.00    Pack years: 0.00    Types: Cigarettes   Smokeless tobacco: Never  Vaping Use   Vaping Use: Never used   Substance Use Topics   Alcohol use: Yes    Comment: occ   Drug use: Yes    Types: Marijuana, Benzodiazepines    Home Medications Prior to Admission medications   Medication Sig Start Date End Date Taking? Authorizing Provider  amoxicillin (AMOXIL) 500 MG capsule Take 1 capsule (500 mg total) by mouth 3 (three) times daily. 06/21/21  Yes Walisiewicz, Caroleen Hamman, PA-C  HYDROcodone-acetaminophen (NORCO/VICODIN) 5-325 MG tablet Take 1 tablet by mouth every 6 (six) hours as needed for severe pain. 06/21/21  Yes Walisiewicz, Aiyah Scarpelli E, PA-C  ibuprofen (ADVIL) 600 MG tablet Take 1 tablet (600 mg total) by mouth every 6 (six) hours as needed for up to 7 days. 06/21/21 06/28/21 Yes Walisiewicz, Yvonna Alanis E, PA-C    Allergies    Peanut-containing drug products  Review of Systems   Review of Systems All other systems are reviewed and are negative for acute change except as noted in the HPI.  Physical Exam Updated Vital Signs BP 140/79 (BP Location: Left Arm)   Pulse 99   Temp 97.9 F (36.6 C) (Oral)   Resp 16   Ht 5\' 11"  (1.803 m)   Wt 83.9 kg   SpO2 96%   BMI 25.80 kg/m   Physical Exam Vitals and nursing note reviewed.  Constitutional:      Appearance: He is well-developed. He is not ill-appearing or toxic-appearing.  HENT:     Head:  Normocephalic and atraumatic.     Comments: Mild left-sided facial swelling to lower jaw.    Nose: Nose normal.     Mouth/Throat:     Comments: Dental exam with marked decay.  Almost all teeth are eroded to base.  Left lower molar with mild inflammatory process.  No gross abscess, no palpable fluctuance.  Patient is tolerating p.o. secretions.  Submandibular region is soft.  No signs of deep space tissue infection.  No visualized or palpable absence of the orbit    Eyes:     General: No scleral icterus.       Right eye: No discharge.        Left eye: No discharge.     Conjunctiva/sclera: Conjunctivae normal.  Neck:     Vascular: No JVD.   Cardiovascular:     Rate and Rhythm: Normal rate and regular rhythm.     Pulses: Normal pulses.     Heart sounds: Normal heart sounds.  Pulmonary:     Effort: Pulmonary effort is normal.     Breath sounds: Normal breath sounds.  Abdominal:     General: There is no distension.  Musculoskeletal:        General: Normal range of motion.     Cervical back: Normal range of motion.  Skin:    General: Skin is warm and dry.  Neurological:     Mental Status: He is oriented to person, place, and time.     GCS: GCS eye subscore is 4. GCS verbal subscore is 5. GCS motor subscore is 6.     Comments: Fluent speech, no facial droop.  Psychiatric:        Behavior: Behavior normal.    ED Results / Procedures / Treatments   Labs (all labs ordered are listed, but only abnormal results are displayed) Labs Reviewed - No data to display  EKG None  Radiology No results found.  Procedures Procedures   Medications Ordered in ED Medications - No data to display  ED Course  I have reviewed the triage vital signs and the nursing notes.  Pertinent labs & imaging results that were available during my care of the patient were reviewed by me and considered in my medical decision making (see chart for details).    MDM Rules/Calculators/A&P                          History provided by patient with additional history obtained from chart review.     Pt is well appearing and is presenting with dental pain. Differential Diagnosis includes but is not limited to: toothache, abscess, periapical abscess, dental caries, cracked tooth, maxillary sinusitis, gingivitis, gum hyperplasia, tooth fracture, tooth dislocation. On exam patient has chronic dental decay. Patient with toothache.  No gross abscess requiring I&D. Exam unconcerning for Ludwig's angina or spread of infection.  Will treat with amoxicillin and anti-inflammatories medicine. Patient given short course of norco for severe pain only. I have  reviewed the PDMP during this encounter. He has no current or recent narcotic prescriptions. He had significant improvement on amoxicillin for dental infection 12/21. Strongly encouraged patient to keep oral surgery appointment scheduled next month as this will likely bea recurring problem. VSS.  Pt appears stable for d/c. Strict return precautions discussed.   Portions of this note were generated with Scientist, clinical (histocompatibility and immunogenetics). Dictation errors may occur despite best attempts at proofreading.   Final Clinical Impression(s) / ED Diagnoses Final diagnoses:  Facial swelling  Dental infection    Rx / DC Orders ED Discharge Orders          Ordered    amoxicillin (AMOXIL) 500 MG capsule  3 times daily        06/21/21 0954    HYDROcodone-acetaminophen (NORCO/VICODIN) 5-325 MG tablet  Every 6 hours PRN        06/21/21 0954    ibuprofen (ADVIL) 600 MG tablet  Every 6 hours PRN        06/21/21 0956             Shanon Ace, PA-C 06/21/21 1011    Milagros Loll, MD 06/24/21 1600

## 2021-06-21 NOTE — Discharge Instructions (Addendum)
-  Prescription for antibiotic sent to your pharmacy amoxicillin.  -Pain medicine hydrocodone sent to pharmacy as well.  Do not take this medicine if you are going to be working or driving as it can make you drowsy.  Take it with food so it does not upset your stomach.  Once you run out of the hydrocodone you can switch to Tylenol.  -For mild pain you can take ibuprofen.  Do not take any additional Aleve, Motrin or Advil at the same time as these are all in the same family.  Is important that you follow-up with a dentist to have your teeth evaluated and possibly pulled to stop this infection from recurring.

## 2021-06-21 NOTE — ED Triage Notes (Signed)
Pt presents to ED with complaints of left sided facial swelling. Pt states he has had some left lower dental pain. Denies fever, or difficulty swallowing.

## 2021-08-20 ENCOUNTER — Emergency Department (HOSPITAL_COMMUNITY)
Admission: EM | Admit: 2021-08-20 | Discharge: 2021-08-20 | Disposition: A | Discharge status: Home / Self Care | Payer: Self-pay | Attending: Emergency Medicine | Admitting: Emergency Medicine

## 2021-08-20 ENCOUNTER — Encounter (HOSPITAL_COMMUNITY): Payer: Self-pay

## 2021-08-20 ENCOUNTER — Other Ambulatory Visit: Payer: Self-pay

## 2021-08-20 DIAGNOSIS — K0889 Other specified disorders of teeth and supporting structures: Secondary | ICD-10-CM | POA: Insufficient documentation

## 2021-08-20 DIAGNOSIS — R1905 Periumbilic swelling, mass or lump: Secondary | ICD-10-CM | POA: Insufficient documentation

## 2021-08-20 DIAGNOSIS — Z91010 Allergy to peanuts: Secondary | ICD-10-CM | POA: Insufficient documentation

## 2021-08-20 DIAGNOSIS — Z87891 Personal history of nicotine dependence: Secondary | ICD-10-CM | POA: Insufficient documentation

## 2021-08-20 MED ORDER — PENICILLIN V POTASSIUM 500 MG PO TABS: 500.0000 mg | ORAL_TABLET | Freq: Four times a day (QID) | ORAL | 0 refills | Status: AC

## 2021-08-20 MED ORDER — OXYCODONE-ACETAMINOPHEN 5-325 MG PO TABS: 1.0000 | ORAL_TABLET | Freq: Three times a day (TID) | ORAL | 0 refills | Status: AC | PRN

## 2021-08-20 NOTE — ED Provider Notes (Signed)
[facility] EMERGENCY DEPARTMENT Provider Note   CSN: [id number] Arrival date & time: 08/20/21  0719     History Chief Complaint  Patient presents with   Dental Pain    Brian Combs is a 30 y.o. male.  HPI  Patient with no significant medical history presents to the emergency department with chief complaint of right dental pain.  Patient states he has been having this pain for last 2 weeks last couple days the pain has gotten worse, he states the pain is on his right lower molar, he endorses some facial swelling, pain with chewing, increased sensitivity to temperature changes, denies  recent trauma to the area.  States that he has poor dental hygiene and notes that he will need this removed.  He does not endorse fevers, chills, trismus, torticollis, difficulty swallowing, denies chest pain, shortness of breath, joint pain or joint swelling.    He also notes that he is having some intermittent pain in his bellybutton, states about 2 days ago he had a bump in it which protruded from it but has now receded and is completely resolved.  Never had this happen to him in the past, denies constipation, admits to passing flatus, denies nausea or vomiting or change in his appetite at this time.  Patient has no complaints at this time.  Does not endorse fevers, chills, shortness of breath, chest pain, abdominal pain, nausea, vomit, diarrhea.  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 No pertinent surgical history.     Family History  Problem Relation Age of Onset   Alcohol abuse Father     Social History   Tobacco Use   Smoking status: Former    Packs/day: 1.00    Types: Cigarettes   Smokeless  tobacco: Never  Vaping Use   Vaping Use: Never used  Substance Use Topics   Alcohol use: Yes    Comment: occ   Drug use: Yes    Types: Marijuana, Benzodiazepines    Home Medications Prior to Admission medications   Medication Sig Start Date End Date Taking? Authorizing Provider  oxyCODONE-acetaminophen (PERCOCET/ROXICET) 5-325 MG tablet Take 1 tablet by mouth every 8 (eight) hours as needed for up to 2 days for severe pain. 08/20/21 08/22/21 Yes Carroll Sage, PA-C  penicillin v potassium (VEETID) 500 MG tablet Take 1 tablet (500 mg total) by mouth 4 (four) times daily for 7 days. 08/20/21 08/27/21 Yes Carroll Sage, PA-C  amoxicillin (AMOXIL) 500 MG capsule Take 1 capsule (500 mg total) by mouth 3 (three) times daily. 06/21/21   Shanon Ace, PA-C  HYDROcodone-acetaminophen (NORCO/VICODIN) 5-325 MG tablet Take 1 tablet by mouth every 6 (six) hours as needed for severe pain. 06/21/21   Shanon Ace, PA-C    Allergies    Peanut-containing drug products  Review of Systems   Review of Systems  Constitutional:  Negative for chills and fever.  HENT:  Positive for dental problem and facial swelling. Negative for congestion, drooling, sore throat and trouble swallowing.   Eyes:  Negative for pain.  Respiratory:  Negative for shortness of breath.   Cardiovascular:  Negative for chest pain.  Gastrointestinal:  Negative for abdominal pain, diarrhea, nausea and  vomiting.  Genitourinary:  Negative for enuresis.  Musculoskeletal:  Negative for back pain.  Skin:  Negative for rash.  Neurological:  Negative for dizziness.  Hematological:  Does not bruise/bleed easily.   Physical Exam Updated Vital Signs BP 130/77 (BP Location: Right Arm)   Pulse 99   Temp 97.7 F (36.5 C) (Oral)   Resp 18   Ht 5\' 11"  (1.803 m)   Wt 83.9 kg   SpO2 95%   BMI 25.80 kg/m   Physical Exam Vitals and nursing note reviewed.  Constitutional:      General: He is not in acute  distress.    Appearance: He is not ill-appearing.  HENT:     Head: Normocephalic and atraumatic.     Comments: No facial swelling noted.    Nose: No congestion.     Mouth/Throat:     Mouth: Mucous membranes are moist.     Pharynx: Oropharynx is clear. No oropharyngeal exudate or posterior oropharyngeal erythema.     Comments: Oropharynx is visualized tongue and uvula are both midline, controlling oral secretions, no tongue protrusion, patient has noted poor dental hygiene, noted tooth decay on the right bottom jaw along his distal molars, gum lines were palpated no fluctuance or induration present.  No trismus or torticollis noted. Eyes:     Extraocular Movements: Extraocular movements intact.     Conjunctiva/sclera: Conjunctivae normal.  Cardiovascular:     Rate and Rhythm: Normal rate and regular rhythm.     Pulses: Normal pulses.     Heart sounds: No murmur heard.   No friction rub. No gallop.  Pulmonary:     Effort: No respiratory distress.     Breath sounds: No wheezing, rhonchi or rales.  Abdominal:     Palpations: Abdomen is soft.     Tenderness: There is no abdominal tenderness. There is no right CVA tenderness or left CVA tenderness.     Comments: Abdomen visualized nondistended, normal bowel sounds, dull to percussion, no visual hernia present.  Abdomen was palpated was nontender to palpation, no guarding, rebound tenderness, peritoneal sign, no masses palpated.  Skin:    General: Skin is warm and dry.  Neurological:     Mental Status: He is alert.  Psychiatric:        Mood and Affect: Mood normal.    ED Results / Procedures / Treatments   Labs (all labs ordered are listed, but only abnormal results are displayed) Labs Reviewed - No data to display  EKG None  Radiology No results found.  Procedures Procedures   Medications Ordered in ED Medications - No data to display  ED Course  I have reviewed the triage vital signs and the nursing notes.  Pertinent  labs & imaging results that were available during my care of the patient were reviewed by me and considered in my medical decision making (see chart for details).    MDM Rules/Calculators/A&P                          Initial impression-patient presents with dental pain and concerns of an abdominal bump.  He is alert, does not appear in acute stress, vital signs reassuring.  Work-up-due to well-appearing patient, benign physical exam, further lab or imaging are not want at this time.   Rule out- I have low suspicion for peritonsillar abscess, retropharyngeal abscess, or Ludwig angina as oropharynx was visualized tongue and uvula were both midline, there is no exudates,  erythema or edema noted in the posterior pillars or on/ around tonsils.  Low suspicion for an abscess as gumline were palpated no fluctuance or induration felt.  Low suspicion for periorbital or orbital cellulitis as patient face had no erythematous, patient EOMs were intact, he had no pain with eye movement.  Low suspicion for endocarditis as is no new heart murmur present, denies chest pain or shortness of breath, no new pedal edema present.  Low suspicion for incarcerated and/or strangulated hernia as there is no palpable mass on my exam, patient is feeling passing gas and flatus, abdomen is soft nontender to palpation.   Plan-  Dental pain-likely secondary due to a cavity, will start him on antibiotics, have him follow-up with a dentist for further evaluation. Abdominal mass-possible this was an umbilical hernia which has reduced itself, we will have him follow-up with PCP for continued monitoring.  Vital signs have remained stable, no indication for hospital admission.   Patient given at home care as well strict return precautions.  Patient verbalized that they understood agreed to said plan.  Final Clinical Impression(s) / ED Diagnoses Final diagnoses:  Pain, dental  Periumbilical mass    Rx / DC Orders ED Discharge  Orders          Ordered    penicillin v potassium (VEETID) 500 MG tablet  4 times daily        08/20/21 1118    oxyCODONE-acetaminophen (PERCOCET/ROXICET) 5-325 MG tablet  Every 8 hours PRN        08/20/21 1118             Barnie Del 08/20/21 1120    Bethann Berkshire, MD 08/23/21 1316

## 2021-08-20 NOTE — ED Triage Notes (Signed)
Pt presents to ED with complaints of right lower dental pain started last week. Pt also with complaints of bump in belly button.

## 2021-08-20 NOTE — Discharge Instructions (Addendum)
I have started you on antibiotics please take as prescribed.I have given you a short course of narcotics please take as prescribed.  This medication can make you drowsy do not consume alcohol or operate heavy machinery when taking this medication.  This medication is Tylenol in it do not take Tylenol and take this medication.  For your dental pain please follow-up with a dentist for further evaluation.  Your stomach mass would like you to follow-up with a primary care provider for further evaluation given contact different above please call.  Come back to the emergency department if you develop chest pain, shortness of breath, severe abdominal pain, uncontrolled nausea, vomiting, diarrhea.

## 2021-10-01 ENCOUNTER — Other Ambulatory Visit: Payer: Self-pay

## 2021-10-01 ENCOUNTER — Encounter (HOSPITAL_COMMUNITY): Payer: Self-pay | Admitting: *Deleted

## 2021-10-01 ENCOUNTER — Emergency Department (HOSPITAL_COMMUNITY)
Admission: EM | Admit: 2021-10-01 | Discharge: 2021-10-01 | Disposition: A | Discharge status: Home / Self Care | Payer: Self-pay | Attending: Emergency Medicine | Admitting: Emergency Medicine

## 2021-10-01 DIAGNOSIS — K047 Periapical abscess without sinus: Secondary | ICD-10-CM | POA: Insufficient documentation

## 2021-10-01 DIAGNOSIS — Z87891 Personal history of nicotine dependence: Secondary | ICD-10-CM | POA: Insufficient documentation

## 2021-10-01 DIAGNOSIS — Z91010 Allergy to peanuts: Secondary | ICD-10-CM | POA: Insufficient documentation

## 2021-10-01 DIAGNOSIS — K029 Dental caries, unspecified: Secondary | ICD-10-CM | POA: Insufficient documentation

## 2021-10-01 MED ORDER — HYDROCODONE-ACETAMINOPHEN 5-325 MG PO TABS: 1.0000 | ORAL_TABLET | Freq: Four times a day (QID) | ORAL | 0 refills | Status: DC | PRN

## 2021-10-01 MED ORDER — IBUPROFEN 600 MG PO TABS: 600.0000 mg | ORAL_TABLET | Freq: Four times a day (QID) | ORAL | 0 refills | Status: DC | PRN

## 2021-10-01 MED ORDER — AMOXICILLIN 250 MG PO CAPS
500.0000 mg | ORAL_CAPSULE | Freq: Once | ORAL | Status: AC
  Administered 2021-10-01: 500 mg via ORAL
  Filled 2021-10-01: qty 2

## 2021-10-01 MED ORDER — AMOXICILLIN 500 MG PO CAPS: 500.0000 mg | ORAL_CAPSULE | Freq: Three times a day (TID) | ORAL | 0 refills | Status: DC

## 2021-10-01 NOTE — ED Provider Notes (Signed)
[facility] EMERGENCY DEPARTMENT Provider Note   CSN: [id number] Arrival date & time: 10/01/21  0830     History Chief Complaint  Patient presents with   Dental Pain    Brian Combs is a 30 y.o. male who is currently under the care of of a local dentist where he is undergoing dental extractions secondary to generalized poor dentition presenting for evaluation of right jawline swelling along with pain of his gingiva of the right lower gum region.  His symptoms started about a week ago and has progressively worsened.  He has been using topical pain relievers which have stopped relieving his pain.  He denies fevers or chills, no difficulty eating or drinking.  There has been no drainage from the site.  He is going to a dental clinic [facility]  He states he can only have an appointment once per month, but with each visit has a few more teeth extracted.  He is scheduled to see them in November.  He has had several antibiotics since mid summer for his dental infections, amoxicillin and penicillin, he states amoxicillin seems to work the best.  The history is provided by the patient.      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 No pertinent surgical history.     Family History  Problem Relation Age of Onset   Alcohol abuse Father     Social History   Tobacco Use   Smoking status: Former    Packs/day: 1.00    Types: Cigarettes   Smokeless tobacco: Never  Vaping Use   Vaping Use: Never used  Substance Use Topics   Alcohol use: Yes    Comment: occ   Drug use: Yes    Types: Marijuana, Benzodiazepines    Home Medications Prior to Admission medications    Medication Sig Start Date End Date Taking? Authorizing Provider  amoxicillin (AMOXIL) 500 MG capsule Take 1 capsule (500 mg total) by mouth 3 (three) times daily. 10/01/21  Yes Tona Qualley, Raynelle Fanning, PA-C  HYDROcodone-acetaminophen (NORCO/VICODIN) 5-325 MG tablet Take 1 tablet by mouth every 6 (six) hours as needed. 10/01/21  Yes Eulalie Speights, Raynelle Fanning, PA-C  ibuprofen (ADVIL) 600 MG tablet Take 1 tablet (600 mg total) by mouth every 6 (six) hours as needed. 10/01/21  Yes Jeanelle Dake, Raynelle Fanning, PA-C    Allergies    Peanut-containing drug products  Review of Systems   Review of Systems  Constitutional:  Negative for fever.  HENT:  Positive for dental problem. Negative for facial swelling and sore throat.   Respiratory:  Negative for shortness of breath.   Musculoskeletal:  Negative for neck pain and neck stiffness.  All other systems reviewed and are negative.  Physical Exam Updated Vital Signs BP 131/89 (BP Location: Right Arm)   Pulse (!) 118   Temp 98.6 F (37 C) (Oral)   Resp 18   Ht 5\' 11"  (1.803 m)   Wt 81.6 kg   SpO2 97%   BMI 25.10 kg/m   Physical Exam Constitutional:      General: He is not in acute distress.    Appearance: He is well-developed.  HENT:     Head: Normocephalic and atraumatic.  Jaw: No trismus.     Right Ear: Tympanic membrane and external ear normal.     Left Ear: Tympanic membrane and external ear normal.     Mouth/Throat:     Mouth: Mucous membranes are moist. No oral lesions.     Dentition: Abnormal dentition. Dental tenderness, gingival swelling and dental caries present.     Pharynx: Oropharynx is clear. No posterior oropharyngeal erythema.     Comments: Patient has generalized poor dentition with decay noted in all remaining teeth.  He has had some extractions on the left-hand side.  His right lower gumline along his molars is erythematous and indurated.  There is no palpable abscess at the site.  Sublingual space is soft.  Uvula is midline. Eyes:      Conjunctiva/sclera: Conjunctivae normal.  Cardiovascular:     Rate and Rhythm: Normal rate.     Heart sounds: Normal heart sounds.  Pulmonary:     Effort: Pulmonary effort is normal.  Abdominal:     General: There is no distension.  Musculoskeletal:        General: Normal range of motion.     Cervical back: Normal range of motion and neck supple.  Lymphadenopathy:     Cervical: No cervical adenopathy.  Skin:    General: Skin is warm and dry.     Findings: No erythema.  Neurological:     Mental Status: He is alert and oriented to person, place, and time.    ED Results / Procedures / Treatments   Labs (all labs ordered are listed, but only abnormal results are displayed) Labs Reviewed - No data to display  EKG None  Radiology No results found.  Procedures Procedures   Medications Ordered in ED Medications  amoxicillin (AMOXIL) capsule 500 mg (has no administration in time range)    ED Course  I have reviewed the triage vital signs and the nursing notes.  Pertinent labs & imaging results that were available during my care of the patient were reviewed by me and considered in my medical decision making (see chart for details).    MDM Rules/Calculators/A&P                           Patient with a dental infection without identifiable or drainable abscess at this time.  He was prescribed amoxicillin and hydrocodone for pain relief.  Also prescribed ibuprofen.  Plan follow-up with his dentist as planned, getting rechecked sooner for any worsening symptoms. Final Clinical Impression(s) / ED Diagnoses Final diagnoses:  Dental infection    Rx / DC Orders ED Discharge Orders          Ordered    amoxicillin (AMOXIL) 500 MG capsule  3 times daily        10/01/21 0922    HYDROcodone-acetaminophen (NORCO/VICODIN) 5-325 MG tablet  Every 6 hours PRN        10/01/21 0922    ibuprofen (ADVIL) 600 MG tablet  Every 6 hours PRN        10/01/21 8469             Burgess Amor, PA-C 10/01/21 6295    Bethann Berkshire, MD 10/04/21 1013

## 2021-10-01 NOTE — Discharge Instructions (Addendum)
Complete your entire course of antibiotics as prescribed.  You  may use the hydrocodone for pain relief but do not drive within 4 hours of taking as this will make you drowsy.  Avoid applying heat or ice to this abscess area which can worsen your symptoms.  You may use warm salt water swish and spit treatment or half peroxide and water swish and spit after meals to keep this area clean as discussed.  Call the dentist listed above for further management of your symptoms.  

## 2021-10-01 NOTE — ED Triage Notes (Signed)
Pt c/o right lower dental pain and swelling x 1 week.

## 2021-11-14 ENCOUNTER — Emergency Department (HOSPITAL_COMMUNITY)
Admission: EM | Admit: 2021-11-14 | Discharge: 2021-11-14 | Disposition: A | Discharge status: Home / Self Care | Payer: Self-pay | Attending: Emergency Medicine | Admitting: Emergency Medicine

## 2021-11-14 ENCOUNTER — Encounter (HOSPITAL_COMMUNITY): Payer: Self-pay

## 2021-11-14 ENCOUNTER — Other Ambulatory Visit: Payer: Self-pay

## 2021-11-14 DIAGNOSIS — K0889 Other specified disorders of teeth and supporting structures: Secondary | ICD-10-CM | POA: Insufficient documentation

## 2021-11-14 DIAGNOSIS — Z91010 Allergy to peanuts: Secondary | ICD-10-CM | POA: Insufficient documentation

## 2021-11-14 DIAGNOSIS — R220 Localized swelling, mass and lump, head: Secondary | ICD-10-CM

## 2021-11-14 DIAGNOSIS — Z87891 Personal history of nicotine dependence: Secondary | ICD-10-CM | POA: Insufficient documentation

## 2021-11-14 MED ORDER — CLINDAMYCIN HCL 150 MG PO CAPS: 300.0000 mg | ORAL_CAPSULE | Freq: Three times a day (TID) | ORAL | 0 refills | Status: AC

## 2021-11-14 MED ORDER — CLINDAMYCIN HCL 150 MG PO CAPS: 150.0000 mg | ORAL_CAPSULE | Freq: Three times a day (TID) | ORAL | 0 refills | Status: DC

## 2021-11-14 NOTE — ED Provider Notes (Signed)
[facility] EMERGENCY DEPARTMENT Provider Note   CSN: [id number] Arrival date & time: 11/14/21  [id number]     History Chief Complaint  Patient presents with   Dental Pain    Brian Combs is a 30 y.o. male.  Patient presents with right lower dental pain started few days ago.  Similar to previous which led to him getting multiple teeth removed.  Patient's poor dentition from being in jail, drug use not take care of his teeth.  No fevers chills or difficulty breathing.  No cough or shortness of breath.  Pain fairly constant.  No allergies to antibiotics.      Past Medical History:  Diagnosis Date   Depression     Patient Active Problem List   Diagnosis Date Noted   Suicidal ideation 02/19/2015   Cannabis use disorder, moderate, dependence ([place]) 02/14/2015   Mild benzodiazepine use disorder ([place]) 02/14/2015   Substance-induced anxiety disorder ([place]) 02/14/2015    History reviewed. No pertinent surgical history.     Family History  Problem Relation Age of Onset   Alcohol abuse Father     Social History   Tobacco Use   Smoking status: Former    Packs/day: 1.00    Types: Cigarettes   Smokeless tobacco: Never  Vaping Use   Vaping Use: Never used  Substance Use Topics   Alcohol use: Yes    Comment: occ   Drug use: Not Currently    Types: Marijuana, Benzodiazepines    Home Medications Prior to Admission medications   Medication Sig Start Date End Date Taking? Authorizing Provider  amoxicillin (AMOXIL) 500 MG capsule Take 1 capsule (500 mg total) by mouth 3 (three) times daily. 10/01/21   Evalee Jefferson, PA-C  clindamycin (CLEOCIN) 150 MG capsule Take 2 capsules (300 mg total) by mouth 3 (three) times daily for 7 days. 11/14/21 11/21/21  Elnora Morrison, MD  HYDROcodone-acetaminophen (NORCO/VICODIN) 5-325 MG tablet Take 1 tablet by mouth every 6 (six) hours as needed. 10/01/21   Evalee Jefferson, PA-C  ibuprofen (ADVIL) 600 MG tablet Take 1 tablet (600 mg total) by mouth  every 6 (six) hours as needed. 10/01/21   Evalee Jefferson, PA-C    Allergies    Peanut-containing drug products  Review of Systems   Review of Systems  Constitutional:  Negative for chills and fever.  HENT:  Positive for dental problem and facial swelling. Negative for congestion.   Eyes:  Negative for visual disturbance.  Respiratory:  Negative for shortness of breath.   Cardiovascular:  Negative for chest pain.  Gastrointestinal:  Negative for abdominal pain and vomiting.  Genitourinary:  Negative for dysuria and flank pain.  Musculoskeletal:  Negative for back pain, neck pain and neck stiffness.  Skin:  Negative for rash.  Neurological:  Negative for light-headedness and headaches.   Physical Exam Updated Vital Signs BP 138/84 (BP Location: Left Arm)   Pulse 98   Temp 97.7 F (36.5 C) (Oral)   Resp 18   Ht 5\' 11"  (1.803 m)   Wt 83.9 kg   SpO2 99%   BMI 25.80 kg/m   Physical Exam Vitals and nursing note reviewed.  Constitutional:      General: He is not in acute distress.    Appearance: He is well-developed.  HENT:     Head: Normocephalic and atraumatic.     Comments: Patient has poor dentition throughout with multiple missing teeth.  Patient has mild swelling and mild anterior cervical adenopathy on the right.  No fluctuance appreciated, mild facial swelling outside, no rash.  Mild tender to palpation in her mucosa/buccal region.  No submandibular swelling or tenderness.  No trismus.    Mouth/Throat:     Mouth: Mucous membranes are moist.  Eyes:     General:        Right eye: No discharge.        Left eye: No discharge.     Conjunctiva/sclera: Conjunctivae normal.  Neck:     Trachea: No tracheal deviation.  Cardiovascular:     Rate and Rhythm: Normal rate.  Pulmonary:     Effort: Pulmonary effort is normal.  Abdominal:     General: There is no distension.  Musculoskeletal:     Cervical back: Normal range of motion and neck supple. No rigidity.  Lymphadenopathy:      Cervical: Cervical adenopathy present.  Skin:    General: Skin is warm.     Capillary Refill: Capillary refill takes less than 2 seconds.     Findings: No rash.  Neurological:     General: No focal deficit present.     Mental Status: He is alert.     Cranial Nerves: No cranial nerve deficit.  Psychiatric:        Mood and Affect: Mood normal.    ED Results / Procedures / Treatments   Labs (all labs ordered are listed, but only abnormal results are displayed) Labs Reviewed - No data to display  EKG None  Radiology No results found.  Procedures Procedures   Medications Ordered in ED Medications - No data to display  ED Course  I have reviewed the triage vital signs and the nursing notes.  Pertinent labs & imaging results that were available during my care of the patient were reviewed by me and considered in my medical decision making (see chart for details).    MDM Rules/Calculators/A&P                           Patient presents with clinical concern for dental infection, likely mild cellulitis/gingival inflammation however may be developing abscess.  No indication for incision and drainage at this time.  Plan for oral antibiotics and follow-up with dentist or returning here if worsening swelling.  Final Clinical Impression(s) / ED Diagnoses Final diagnoses:  Pain, dental  Facial swelling    Rx / DC Orders ED Discharge Orders          Ordered    clindamycin (CLEOCIN) 150 MG capsule  3 times daily,   Status:  Discontinued        11/14/21 0939    clindamycin (CLEOCIN) 150 MG capsule  3 times daily        11/14/21 0941             Blane Ohara, MD 11/14/21 0945

## 2021-11-14 NOTE — Discharge Instructions (Signed)
Follow-up closely with your dentist. Take ibuprofen 600 mg every 6 hours as needed for pain.  You can take Tylenol every 4 hours 1000 mg as needed for pain. These are over the counter. Return for breathing difficulty, throat swelling or new concerns.

## 2021-11-14 NOTE — ED Triage Notes (Signed)
Pt presents to ED c/o right lower dental pain started few days ago.

## 2022-03-25 ENCOUNTER — Emergency Department (HOSPITAL_COMMUNITY)
Admission: EM | Admit: 2022-03-25 | Discharge: 2022-03-25 | Disposition: A | Discharge status: Home / Self Care | Payer: Self-pay | Attending: Student | Admitting: Student

## 2022-03-25 ENCOUNTER — Other Ambulatory Visit: Payer: Self-pay

## 2022-03-25 ENCOUNTER — Encounter (HOSPITAL_COMMUNITY): Payer: Self-pay | Admitting: *Deleted

## 2022-03-25 DIAGNOSIS — Z87891 Personal history of nicotine dependence: Secondary | ICD-10-CM | POA: Insufficient documentation

## 2022-03-25 DIAGNOSIS — K0889 Other specified disorders of teeth and supporting structures: Secondary | ICD-10-CM

## 2022-03-25 DIAGNOSIS — K047 Periapical abscess without sinus: Secondary | ICD-10-CM | POA: Insufficient documentation

## 2022-03-25 MED ORDER — AMOXICILLIN-POT CLAVULANATE 875-125 MG PO TABS: 1.0000 | ORAL_TABLET | Freq: Two times a day (BID) | ORAL | 0 refills | Status: DC

## 2022-03-25 MED ORDER — NAPROXEN 250 MG PO TABS
500.0000 mg | ORAL_TABLET | Freq: Once | ORAL | Status: AC
  Administered 2022-03-25: 500 mg via ORAL
  Filled 2022-03-25: qty 2

## 2022-03-25 MED ORDER — NAPROXEN 375 MG PO TABS: 375.0000 mg | ORAL_TABLET | Freq: Two times a day (BID) | ORAL | 0 refills | Status: DC

## 2022-03-25 MED ORDER — AMOXICILLIN-POT CLAVULANATE 875-125 MG PO TABS
1.0000 | ORAL_TABLET | Freq: Once | ORAL | Status: AC
  Administered 2022-03-25: 1 via ORAL
  Filled 2022-03-25: qty 1

## 2022-03-25 NOTE — ED Provider Notes (Addendum)
?[facility] EMERGENCY DEPARTMENT ?Provider Note ? ?CSN: [id number] ?Arrival date & time: 03/25/22 0839 ? ?Chief Complaint(s) ?Abscess ? ?HPI ?Brian Combs is a 31 y.o. male who presents emergency department for evaluation of dental pain and an abscess.  Patient states that he is currently undergoing a full mouth restoration for dental problems he developed while in prison.  He states that he has pain and swelling around tooth #11 and 12 mm what he states is a visible abscess.  Denies chest pain, shortness of breath, abdominal pain, nausea, vomiting, mandibular swelling, submental swelling, fever or other systemic symptoms. ? ? ?Abscess ? ?Past Medical History ?Past Medical History:  ?Diagnosis Date  ? Depression   ? ?Patient Active Problem List  ? Diagnosis Date Noted  ? Suicidal ideation 02/19/2015  ? Cannabis use disorder, moderate, dependence (HCC) 02/14/2015  ? Mild benzodiazepine use disorder (HCC) 02/14/2015  ? Substance-induced anxiety disorder (HCC) 02/14/2015  ? ?Home Medication(s) ?Prior to Admission medications   ?Medication Sig Start Date End Date Taking? Authorizing Provider  ?amoxicillin-clavulanate (AUGMENTIN) 875-125 MG tablet Take 1 tablet by mouth every 12 (twelve) hours. 03/25/22  Yes Numair Masden, MD  ?naproxen (NAPROSYN) 375 MG tablet Take 1 tablet (375 mg total) by mouth 2 (two) times daily. 03/25/22  Yes Mariajose Mow, MD  ?amoxicillin (AMOXIL) 500 MG capsule Take 1 capsule (500 mg total) by mouth 3 (three) times daily. 10/01/21   Burgess Amor, PA-C  ?HYDROcodone-acetaminophen (NORCO/VICODIN) 5-325 MG tablet Take 1 tablet by mouth every 6 (six) hours as needed. 10/01/21   Burgess Amor, PA-C  ?ibuprofen (ADVIL) 600 MG tablet Take 1 tablet (600 mg total) by mouth every 6 (six) hours as needed. 10/01/21   Burgess Amor, PA-C  ?                                                                                                                                  ?Past Surgical History ?History reviewed.  No pertinent surgical history. ?Family History ?Family History  ?Problem Relation Age of Onset  ? Alcohol abuse Father   ? ? ?Social History ?Social History  ? ?Tobacco Use  ? Smoking status: Former  ?  Packs/day: 1.00  ?  Types: Cigarettes  ? Smokeless tobacco: Never  ?Vaping Use  ? Vaping Use: Never used  ?Substance Use Topics  ? Alcohol use: Yes  ?  Comment: occ  ? Drug use: Not Currently  ?  Types: Marijuana, Benzodiazepines  ? ?Allergies ?Peanut-containing drug products ? ?Review of Systems ?Review of Systems  ?HENT:  Positive for dental problem.   ? ?Physical Exam ?Vital Signs  ?I have reviewed the triage vital signs ?BP (!) 125/94 (BP Location: Left Arm)   Pulse (!) 110   Resp 14   Ht 5\' 11"  (1.803 m)   Wt 83.9 kg   SpO2 99%   BMI 25.80 kg/m?  ? ?Physical Exam ?Vitals and nursing note  reviewed.  ?Constitutional:   ?   General: He is not in acute distress. ?   Appearance: He is well-developed.  ?HENT:  ?   Head: Normocephalic and atraumatic.  ?   Comments: Multiple dental preps, caries, small area of swelling at the gumline near tooth #11 and 12.  No obvious abscess, possible mucocele ?Eyes:  ?   Conjunctiva/sclera: Conjunctivae normal.  ?Cardiovascular:  ?   Rate and Rhythm: Normal rate and regular rhythm.  ?   Heart sounds: No murmur heard. ?Pulmonary:  ?   Effort: Pulmonary effort is normal. No respiratory distress.  ?   Breath sounds: Normal breath sounds.  ?Abdominal:  ?   Palpations: Abdomen is soft.  ?   Tenderness: There is no abdominal tenderness.  ?Musculoskeletal:     ?   General: No swelling.  ?   Cervical back: Neck supple.  ?Skin: ?   General: Skin is warm and dry.  ?   Capillary Refill: Capillary refill takes less than 2 seconds.  ?Neurological:  ?   Mental Status: He is alert.  ?Psychiatric:     ?   Mood and Affect: Mood normal.  ? ? ?ED Results and Treatments ?Labs ?(all labs ordered are listed, but only abnormal results are displayed) ?Labs Reviewed - No data to display                                                                                                                        ? ?Radiology ?No results found. ? ?Pertinent labs & imaging results that were available during my care of the patient were reviewed by me and considered in my medical decision making (see MDM for details). ? ?Medications Ordered in ED ?Medications  ?amoxicillin-clavulanate (AUGMENTIN) 875-125 MG per tablet 1 tablet (has no administration in time range)  ?naproxen (NAPROSYN) tablet 500 mg (has no administration in time range)  ?                                                               ?                                                                    ?Procedures ?Procedures ? ?(including critical care time) ? ?Medical Decision Making / ED Course ? ? ?This patient presents to the ED for concern of dental pain, this involves an extensive number of treatment options, and is a complaint that carries with it a high risk of complications and morbidity.  The differential diagnosis  includes dental caries, periapical abscess, mucocele ? ?MDM: ?Patient seen in the emergency department for evaluation of dental pain and swelling.  Physical exam with multiple dental preps, dental caries and a tender erythematous gingiva with a small swelling at the gumline between tooth 11 and 12.  No obvious periapical abscess that would allow for ED drainage.  Patient started on Augmentin and Naprosyn and discharged with dental follow-up. ? ? ?Additional history obtained: ? ?-External records from outside source obtained and reviewed including: Chart review including previous notes, labs, imaging, consultation notes ? ? ?Medicines ordered and prescription drug management: ?Meds ordered this encounter  ?Medications  ? amoxicillin-clavulanate (AUGMENTIN) 875-125 MG per tablet 1 tablet  ? naproxen (NAPROSYN) tablet 500 mg  ? amoxicillin-clavulanate (AUGMENTIN) 875-125 MG tablet  ?  Sig: Take 1 tablet by mouth every 12 (twelve) hours.  ?   Dispense:  14 tablet  ?  Refill:  0  ? naproxen (NAPROSYN) 375 MG tablet  ?  Sig: Take 1 tablet (375 mg total) by mouth 2 (two) times daily.  ?  Dispense:  20 tablet  ?  Refill:  0  ?  ?-I have reviewed the patients home medicines and have made adjustments as needed ? ?Critical interventions ?None ? ?Social Determinants of Health:  ?Factors impacting patients care include: none ? ? ?Reevaluation: ?After the interventions noted above, I reevaluated the patient and found that they have :improved ? ?Co morbidities that complicate the patient evaluation ? ?Past Medical History:  ?Diagnosis Date  ? Depression   ?  ? ? ?Dispostion: ?I considered admission for this patient, but his dental pain can be followed outside the hospital with his current dentist, and he is not displaying signs of Ludwig's angina at this time, thus safe for discharge with outpatient follow-up. ? ? ? ? ?Final Clinical Impression(s) / ED Diagnoses ?Final diagnoses:  ?Pain, dental  ? ? ? ?@PCDICTATION @ ? ?  ?Glendora ScoreKommor, Bauer Ausborn, MD ?03/25/22 0930 ? ?  ?Glendora ScoreKommor, Fitzhugh Vizcarrondo, MD ?03/25/22 [phone] ? ?

## 2022-03-25 NOTE — ED Triage Notes (Signed)
Pt c/o "abscess" to upper gumline x 5 days. Denies fever.  ?

## 2022-06-05 ENCOUNTER — Encounter (HOSPITAL_COMMUNITY): Payer: Self-pay | Admitting: Emergency Medicine

## 2022-06-05 ENCOUNTER — Emergency Department (HOSPITAL_COMMUNITY)
Admission: EM | Admit: 2022-06-05 | Discharge: 2022-06-05 | Disposition: A | Discharge status: Home / Self Care | Payer: Self-pay | Attending: Emergency Medicine | Admitting: Emergency Medicine

## 2022-06-05 ENCOUNTER — Other Ambulatory Visit: Payer: Self-pay

## 2022-06-05 DIAGNOSIS — Z91010 Allergy to peanuts: Secondary | ICD-10-CM | POA: Insufficient documentation

## 2022-06-05 DIAGNOSIS — K047 Periapical abscess without sinus: Secondary | ICD-10-CM | POA: Insufficient documentation

## 2022-06-05 MED ORDER — AMOXICILLIN-POT CLAVULANATE 875-125 MG PO TABS
1.0000 | ORAL_TABLET | Freq: Once | ORAL | Status: AC
  Administered 2022-06-05: 1 via ORAL
  Filled 2022-06-05: qty 1

## 2022-06-05 MED ORDER — AMOXICILLIN-POT CLAVULANATE 875-125 MG PO TABS: 1.0000 | ORAL_TABLET | Freq: Two times a day (BID) | ORAL | 0 refills | Status: DC

## 2022-06-05 MED ORDER — IBUPROFEN 800 MG PO TABS: 800.0000 mg | ORAL_TABLET | Freq: Three times a day (TID) | ORAL | 0 refills | Status: DC

## 2022-06-05 MED ORDER — DEXAMETHASONE 4 MG PO TABS
6.0000 mg | ORAL_TABLET | Freq: Once | ORAL | Status: AC
  Administered 2022-06-05: 6 mg via ORAL

## 2022-07-13 ENCOUNTER — Encounter (HOSPITAL_COMMUNITY): Payer: Self-pay

## 2022-07-13 ENCOUNTER — Emergency Department (HOSPITAL_COMMUNITY)
Admission: EM | Admit: 2022-07-13 | Discharge: 2022-07-13 | Disposition: A | Discharge status: Home / Self Care | Payer: Self-pay | Attending: Emergency Medicine | Admitting: Emergency Medicine

## 2022-07-13 ENCOUNTER — Other Ambulatory Visit: Payer: Self-pay

## 2022-07-13 DIAGNOSIS — K029 Dental caries, unspecified: Secondary | ICD-10-CM | POA: Insufficient documentation

## 2022-07-13 DIAGNOSIS — K0889 Other specified disorders of teeth and supporting structures: Secondary | ICD-10-CM | POA: Insufficient documentation

## 2022-07-13 MED ORDER — KETOROLAC TROMETHAMINE 60 MG/2ML IM SOLN
30.0000 mg | Freq: Once | INTRAMUSCULAR | Status: AC
  Administered 2022-07-13: 30 mg via INTRAMUSCULAR
  Filled 2022-07-13: qty 2

## 2022-07-13 MED ORDER — AMOXICILLIN-POT CLAVULANATE 875-125 MG PO TABS
1.0000 | ORAL_TABLET | Freq: Two times a day (BID) | ORAL | 0 refills | Status: DC
Start: 2022-07-13 — End: 2023-03-20

## 2022-07-13 NOTE — ED Triage Notes (Signed)
Patient reports dental pain and swelling to bilateral lower mouth x 2 days.

## 2022-07-13 NOTE — ED Provider Notes (Signed)
[facility] EMERGENCY DEPARTMENT Provider Note   CSN: [id number] Arrival date & time: 07/13/22  9518     History  Chief Complaint  Patient presents with   Oral Swelling    Brian Combs is a 31 y.o. male.  HPI Patient presents for dental pain.  Medical history includes polysubstance use, anxiety, depression.  He was last seen in the ED 5 weeks ago for dental pain and swelling to the left lower jaw.  He was prescribed Augmentin and advised to follow-up with dentist.  He did complete his antibiotics.  Unfortunately, he was not able to follow-up with a dentist.  He reports that he had improved swelling but that the swelling has recurred over the past 3 days.  This is located on the left lower premolar area.  He is also experienced pain on the right lower premolar area.  Currently, he has a dentist appointment scheduled 2 weeks from now.  Patient denies any systemic symptoms of fevers, chills, nausea, or vomiting.  He has not taken anything for pain today.     Home Medications Prior to Admission medications   Medication Sig Start Date End Date Taking? Authorizing Provider  amoxicillin-clavulanate (AUGMENTIN) 875-125 MG tablet Take 1 tablet by mouth every 12 (twelve) hours. 07/13/22   Gloris Manchester, MD  HYDROcodone-acetaminophen (NORCO/VICODIN) 5-325 MG tablet Take 1 tablet by mouth every 6 (six) hours as needed. 10/01/21   Burgess Amor, PA-C  ibuprofen (ADVIL) 800 MG tablet Take 1 tablet (800 mg total) by mouth 3 (three) times daily. 06/05/22   Theron Arista, PA-C      Allergies    Peanut-containing drug products    Review of Systems   Review of Systems  HENT:  Positive for dental problem and facial swelling.   All other systems reviewed and are negative.   Physical Exam Updated Vital Signs BP 131/87 (BP Location: Left Arm)   Pulse 76   Temp 98.9 F (37.2 C) (Oral)   Resp 15   Ht 5\' 11"  (1.803 m)   Wt 83.9 kg   SpO2 98%   BMI 25.80 kg/m  Physical Exam Vitals and nursing note  reviewed.  Constitutional:      General: He is not in acute distress.    Appearance: Normal appearance. He is well-developed. He is not ill-appearing, toxic-appearing or diaphoretic.  HENT:     Head: Normocephalic and atraumatic.     Right Ear: External ear normal.     Left Ear: External ear normal.     Nose: Nose normal.     Mouth/Throat:     Mouth: Mucous membranes are moist.     Pharynx: Oropharynx is clear.     Comments: No appreciable areas of induration or fluctuance.  No submental or sublingual swelling.  Multiple dental caries are present. Eyes:     General: No scleral icterus.    Extraocular Movements: Extraocular movements intact.     Conjunctiva/sclera: Conjunctivae normal.  Cardiovascular:     Rate and Rhythm: Normal rate and regular rhythm.     Heart sounds: No murmur heard. Pulmonary:     Effort: Pulmonary effort is normal. No respiratory distress.  Abdominal:     General: There is no distension.     Palpations: Abdomen is soft.     Tenderness: There is no abdominal tenderness.  Musculoskeletal:        General: No swelling. Normal range of motion.     Cervical back: Normal range of motion and neck  supple. No rigidity.  Skin:    General: Skin is warm and dry.     Coloration: Skin is not jaundiced or pale.  Neurological:     General: No focal deficit present.     Mental Status: He is alert and oriented to person, place, and time.     Cranial Nerves: No cranial nerve deficit.     Sensory: No sensory deficit.     Motor: No weakness.     Coordination: Coordination normal.  Psychiatric:        Mood and Affect: Mood normal.        Behavior: Behavior normal.        Thought Content: Thought content normal.        Judgment: Judgment normal.     ED Results / Procedures / Treatments   Labs (all labs ordered are listed, but only abnormal results are displayed) Labs Reviewed - No data to display  EKG None  Radiology No results found.  Procedures Procedures     Medications Ordered in ED Medications  ketorolac (TORADOL) injection 30 mg (30 mg Intramuscular Given 07/13/22 0804)    ED Course/ Medical Decision Making/ A&P                           Medical Decision Making Risk Prescription drug management.   This patient presents to the ED for concern of dental pain, this involves an extensive number of treatment options, and is a complaint that carries with it a high risk of complications and morbidity.  The differential diagnosis includes dental caries,.  Abscess, Ludwig's angina   Co morbidities that complicate the patient evaluation  Depression, substance use   Additional history obtained:  Additional history obtained from N/A External records from outside source obtained and reviewed including EMR   Problem List / ED Course / Critical interventions / Medication management  Patient presents for dental pain and concern of facial swelling.  He has had history of the same and was seen in the ED 1 month ago.  He has not followed up with dentistry since that time.  Patient reports that he has had worsened pain in the left lower premolar area over the past 3 days.  He does feel some subjective swelling in this area.  He has also had new worsened pain in the right lower premolar area.  Patient is afebrile on arrival.  He denies any subjective fevers or chills at home.  On inspection of his oral cavity, patient has multiple dental caries.  There is no areas of induration or fluctuance.  It is difficult to appreciate the left-sided facial swelling that the patient describes.  Floor of mouth and submental area are soft without any swelling.  Oropharynx is widely patent.  Given no drainable abscess here in the ED, patient is appropriate for discharge with antibiotics and was advised to follow-up with dentist as soon as possible.  Currently he has a appointment scheduled for 2 weeks from now.  He was given a list of resources to obtain possible closer  follow-up.  He was discharged in good condition. I ordered medication including Toradol for nausea Reevaluation of the patient after these medicines showed that the patient improved I have reviewed the patients home medicines and have made adjustments as needed   Social Determinants of Health:  Does not have PCP          Final Clinical Impression(s) / ED Diagnoses Final diagnoses:  Pain, dental    Rx / DC Orders ED Discharge Orders          Ordered    amoxicillin-clavulanate (AUGMENTIN) 875-125 MG tablet  Every 12 hours        07/13/22 0929              Godfrey Pick, MD 07/13/22 [phone]

## 2022-07-13 NOTE — Discharge Instructions (Addendum)
Continue ibuprofen and Tylenol as needed for pain.  A prescription for antibiotics was sent to your pharmacy.  Follow-up with your dentist as soon as possible.  There is a list of contact information attached to possibly obtain closer dental follow-up.  If you experience worsening symptoms despite antibiotics, please return to the emergency department.

## 2023-03-20 ENCOUNTER — Emergency Department (HOSPITAL_COMMUNITY)
Admission: EM | Admit: 2023-03-20 | Discharge: 2023-03-20 | Disposition: A | Discharge status: Home / Self Care | Payer: Self-pay | Attending: Emergency Medicine | Admitting: Emergency Medicine

## 2023-03-20 ENCOUNTER — Other Ambulatory Visit: Payer: Self-pay

## 2023-03-20 DIAGNOSIS — K047 Periapical abscess without sinus: Secondary | ICD-10-CM | POA: Insufficient documentation

## 2023-03-20 DIAGNOSIS — Z91010 Allergy to peanuts: Secondary | ICD-10-CM | POA: Insufficient documentation

## 2023-03-20 DIAGNOSIS — K029 Dental caries, unspecified: Secondary | ICD-10-CM | POA: Insufficient documentation

## 2023-03-20 MED ORDER — IBUPROFEN 400 MG PO TABS
400.0000 mg | ORAL_TABLET | Freq: Once | ORAL | Status: AC
  Administered 2023-03-20: 400 mg via ORAL
  Filled 2023-03-20: qty 1

## 2023-03-20 MED ORDER — AMOXICILLIN 500 MG PO CAPS: 500.0000 mg | ORAL_CAPSULE | Freq: Three times a day (TID) | ORAL | 0 refills | Status: DC

## 2023-03-20 MED ORDER — AMOXICILLIN 250 MG PO CAPS
500.0000 mg | ORAL_CAPSULE | Freq: Once | ORAL | Status: AC
Start: 2023-03-20 — End: 2023-03-20
  Administered 2023-03-20: 500 mg via ORAL
  Filled 2023-03-20: qty 2

## 2023-03-20 NOTE — ED Provider Notes (Signed)
  [place] EMERGENCY DEPARTMENT AT [facility] Provider Note   CSN: [id number] Arrival date & time: 03/20/23  2245     History  Chief Complaint  Patient presents with   Abscess    Brian Combs is a 32 y.o. male.  The history is provided by the patient.  Patient presents with dental pain and swelling over the past day.  He reports he is trying to get all of his teeth extracted, but has to have them pulled out 1 at a time. He reports swelling in the right upper face over the past day due to dental infection.  No fevers or vomiting.  No visual changes.     Home Medications Prior to Admission medications   Medication Sig Start Date End Date Taking? Authorizing Provider  amoxicillin (AMOXIL) 500 MG capsule Take 1 capsule (500 mg total) by mouth 3 (three) times daily. 03/20/23  Yes Zadie Rhine, MD      Allergies    Peanut-containing drug products    Review of Systems   Review of Systems  Constitutional:  Negative for fever.  HENT:  Positive for dental problem.   Eyes:  Negative for visual disturbance.    Physical Exam Updated Vital Signs BP 135/87   Pulse 86   Temp 98 F (36.7 C)   Resp 18   Ht 1.803 m (5\' 11" )   Wt 83 kg   SpO2 97%   BMI 25.52 kg/m  Physical Exam CONSTITUTIONAL: Well developed/well nourished HEAD AND FACE: Normocephalic/atraumatic EYES: EOMI/PERRL, no proptosis ENMT: Mucous membranes moist.  Poor dentition.  No trismus.  No focal abscess noted. Diffuse tenderness to gingiva noted Mild facial swelling over right maxilla NECK: supple no meningeal signs NEURO: Pt is awake/alert, moves all extremitiesx4 EXTREMITIES:full ROM SKIN: warm, color normal  ED Results / Procedures / Treatments   Labs (all labs ordered are listed, but only abnormal results are displayed) Labs Reviewed - No data to display  EKG None  Radiology No results found.  Procedures Procedures    Medications Ordered in ED Medications  amoxicillin  (AMOXIL) capsule 500 mg (has no administration in time range)  ibuprofen (ADVIL) tablet 400 mg (has no administration in time range)    ED Course/ Medical Decision Making/ A&P                             Medical Decision Making Risk Prescription drug management.   Patient with widespread dental decay now has facial swelling over the right maxilla.  No evidence of any orbital involvement.  He is otherwise in no distress, resting comfortably.  He already plans to follow-up with dentistry for further dental extraction        Final Clinical Impression(s) / ED Diagnoses Final diagnoses:  Dental abscess    Rx / DC Orders ED Discharge Orders          Ordered    amoxicillin (AMOXIL) 500 MG capsule  3 times daily        03/20/23 2319              Zadie Rhine, MD 03/20/23 2334

## 2023-03-20 NOTE — ED Triage Notes (Signed)
Right sided facial swelling x 1 day due to dental cavities.

## 2023-03-20 NOTE — Discharge Instructions (Addendum)
You can take ibuprofen 400mg  three times day for the next week while taking this antibiotic

## 2024-07-14 ENCOUNTER — Emergency Department (HOSPITAL_COMMUNITY)
Admission: EM | Admit: 2024-07-14 | Discharge: 2024-07-14 | Disposition: A | Discharge status: Home / Self Care | Payer: Self-pay | Attending: Emergency Medicine | Admitting: Emergency Medicine

## 2024-07-14 ENCOUNTER — Encounter (HOSPITAL_COMMUNITY): Payer: Self-pay | Admitting: Emergency Medicine

## 2024-07-14 ENCOUNTER — Other Ambulatory Visit: Payer: Self-pay

## 2024-07-14 DIAGNOSIS — K047 Periapical abscess without sinus: Secondary | ICD-10-CM | POA: Insufficient documentation

## 2024-07-14 DIAGNOSIS — K029 Dental caries, unspecified: Secondary | ICD-10-CM | POA: Insufficient documentation

## 2024-07-14 DIAGNOSIS — L0291 Cutaneous abscess, unspecified: Secondary | ICD-10-CM

## 2024-07-14 MED ORDER — HYDROCODONE-ACETAMINOPHEN 5-325 MG PO TABS: 1.0000 | ORAL_TABLET | Freq: Four times a day (QID) | ORAL | 0 refills | Status: AC | PRN

## 2024-07-14 MED ORDER — AMOXICILLIN 500 MG PO CAPS: 500.0000 mg | ORAL_CAPSULE | Freq: Three times a day (TID) | ORAL | 0 refills | Status: DC

## 2024-07-14 NOTE — Discharge Instructions (Signed)
 Take the entire course of the antibiotics which should help you not only with your facial infection but also your dental infection.  Avoid squeezing your face which can make it worse.  I recommend warm or hot as tolerated water  compresses applied to your left cheek which can also help resolve this infection.  There is no indication for opening this up or lancing today.  However if today's treatment plan does not resolve this facial infection I do encourage you to return for recheck.  Do not drive within 4 hours of taking the hydrocodone  as this will make you drowsy.  Your ibuprofen  should relieve your pain once the antibiotic is starting to work.

## 2024-07-14 NOTE — ED Triage Notes (Signed)
 Pt c/o of abscess to left side of face x3days.

## 2024-07-14 NOTE — ED Provider Notes (Signed)
 [place] EMERGENCY DEPARTMENT AT [facility] Provider Note   CSN: [id number] Arrival date & time: 07/14/24  9163     Patient presents with: Abscess   Brian Combs is a 33 y.o. male presenting with a tender nodule on his left cheek, also with complaints of chronic dental decay and currently has a small abscess although he states he popped the dental abscess and it has since drained.  He has had no fevers or chills, no other complaints.  He tried to squeeze the facial infection but it worsened without draining any purulence.  He took ibuprofen  600 mg from a prior prescription which did not relieve his pain.  He has advanced dental decay and has been in the process of having his teeth removed over time through the free clinic.  He is anticipating continuing having his teeth completely extracted over time.   The history is provided by the patient.       Prior to Admission medications   Medication Sig Start Date End Date Taking? Authorizing Provider  amoxicillin  (AMOXIL ) 500 MG capsule Take 1 capsule (500 mg total) by mouth 3 (three) times daily. 07/14/24  Yes Esley Brooking, PA-C  HYDROcodone -acetaminophen  (NORCO/VICODIN) 5-325 MG tablet Take 1 tablet by mouth every 6 (six) hours as needed for up to 2 days. 07/14/24 07/16/24 Yes Reymond Maynez, Mliss, PA-C    Allergies: Peanut-containing drug products    Review of Systems  Constitutional:  Negative for fever.  HENT:  Positive for dental problem. Negative for facial swelling and sore throat.   Respiratory:  Negative for shortness of breath.   Musculoskeletal:  Negative for neck pain and neck stiffness.  Skin:        Negative except as mentioned in HPI.      Updated Vital Signs BP 116/75   Pulse 87   Temp 98.4 F (36.9 C) (Oral)   Resp 18   Ht 5' 11 (1.803 m)   Wt 77.1 kg   SpO2 99%   BMI 23.71 kg/m   Physical Exam Constitutional:      General: He is not in acute distress.    Appearance: He is well-developed.  HENT:      Head: Normocephalic and atraumatic.     Jaw: No trismus.     Right Ear: Tympanic membrane and external ear normal.     Left Ear: Tympanic membrane and external ear normal.     Mouth/Throat:     Mouth: No oral lesions.     Dentition: Dental abscesses present.     Comments: Poor dentition with multiple areas of dental extractions, remaining teeth appear in some degree of decay.  There is erythema at his right upper gingiva without fluctuant abscess pocket.  Approximately 1 cm raised indurated papule left cheek.  This does not appear to arise from a dental source.  No surrounding erythema, no drainage. Eyes:     Conjunctiva/sclera: Conjunctivae normal.  Cardiovascular:     Rate and Rhythm: Normal rate.     Heart sounds: Normal heart sounds.  Pulmonary:     Effort: Pulmonary effort is normal.  Abdominal:     General: There is no distension.  Musculoskeletal:        General: Normal range of motion.     Cervical back: Normal range of motion and neck supple.  Lymphadenopathy:     Cervical: No cervical adenopathy.  Skin:    General: Skin is warm and dry.     Findings: No erythema.  Neurological:     Mental Status: He is alert and oriented to person, place, and time.     (all labs ordered are listed, but only abnormal results are displayed) Labs Reviewed - No data to display  EKG: None  Radiology: No results found.   Procedures   Medications Ordered in the ED - No data to display                                  Medical Decision Making Patient presenting with chronic dental decay along with a enlarged tender papule left cheek, no indication for I&D at this time.  He is placed on antibiotics, he has been under the care of the free clinic of [facility], encouraged follow-up with them as needed.  Risk Prescription drug management.        Final diagnoses:  Abscess  Dental decay    ED Discharge Orders          Ordered    amoxicillin  (AMOXIL ) 500 MG capsule   3 times daily        07/14/24 0928    HYDROcodone -acetaminophen  (NORCO/VICODIN) 5-325 MG tablet  Every 6 hours PRN        07/14/24 0928               Wilmon Conover, PA-C 07/14/24 9068    Patsey Lot, MD 07/14/24 1447

## 2024-10-23 ENCOUNTER — Encounter (HOSPITAL_COMMUNITY): Payer: Self-pay | Admitting: Emergency Medicine

## 2024-10-23 ENCOUNTER — Emergency Department (HOSPITAL_COMMUNITY)
Admission: EM | Admit: 2024-10-23 | Discharge: 2024-10-23 | Disposition: A | Discharge status: Home / Self Care | Payer: Self-pay | Attending: Emergency Medicine | Admitting: Emergency Medicine

## 2024-10-23 ENCOUNTER — Other Ambulatory Visit: Payer: Self-pay

## 2024-10-23 DIAGNOSIS — K047 Periapical abscess without sinus: Secondary | ICD-10-CM | POA: Insufficient documentation

## 2024-10-23 DIAGNOSIS — Z91010 Allergy to peanuts: Secondary | ICD-10-CM | POA: Insufficient documentation

## 2024-10-23 MED ORDER — AMOXICILLIN-POT CLAVULANATE 875-125 MG PO TABS: 1.0000 | ORAL_TABLET | Freq: Two times a day (BID) | ORAL | 0 refills | Status: AC

## 2024-10-23 MED ORDER — IBUPROFEN 800 MG PO TABS: 800.0000 mg | ORAL_TABLET | Freq: Three times a day (TID) | ORAL | 0 refills | Status: AC

## 2024-10-23 NOTE — ED Provider Notes (Signed)
 [place] EMERGENCY DEPARTMENT AT [facility] Provider Note   CSN: [id number] Arrival date & time: 10/23/24  1455     Patient presents with: Dental Problem   Brian Combs is a 33 y.o. male with a history of chronic dental decay presents to the ED with left sided facial swelling with associated left sided jaw pain. Patient states that he has a history of dental abscesses that he states he usually pops at home with a needle, they drain, and then they go away. He states that he noticed a small abscess on the lower left gumline a week ago and he popped and drained it himself with relief, and over the last couple days he has noticed gum and facial swelling in the same area. He indorses associated pain. He denies fevers or purulent discharge at this time. He is states that he is in the process of trying to get in with dentistry however he has been in jail. He has had no fevers or chills.    HPI     Prior to Admission medications   Medication Sig Start Date End Date Taking? Authorizing Provider  amoxicillin -clavulanate (AUGMENTIN ) 875-125 MG tablet Take 1 tablet by mouth 2 (two) times daily for 10 days. 10/23/24 11/02/24 Yes Gen Clagg L, PA  ibuprofen  (ADVIL ) 800 MG tablet Take 1 tablet (800 mg total) by mouth 3 (three) times daily for 5 days. 10/23/24 10/28/24 Yes Yani Lal L, PA    Allergies: Peanut-containing drug products    Review of Systems  HENT:  Positive for dental problem.     Updated Vital Signs BP 129/72   Pulse 77   Temp (!) 96.8 F (36 C)   Resp 16   Ht 5' 11 (1.803 m)   Wt 77.1 kg   SpO2 99%   BMI 23.71 kg/m   Physical Exam Vitals and nursing note reviewed.  Constitutional:      General: He is not in acute distress.    Appearance: Normal appearance.  HENT:     Head: Normocephalic and atraumatic.     Mouth/Throat:     Lips: Pink.     Mouth: Mucous membranes are moist.     Dentition: Abnormal dentition. Dental tenderness, gingival  swelling and dental abscesses present.     Pharynx: Oropharynx is clear. Uvula midline.     Comments: Patient has swelling to the mandibular buccal surface of premolar/molar molar #22, #21, #20, & #19.  No fluctuation.  No purulent discharge.  TTP.  Slight swelling to the lower lateral portion of face. No airway compromise. No difficulty with phonation. No trouble swallowing.  Eyes:     Extraocular Movements: Extraocular movements intact.     Conjunctiva/sclera: Conjunctivae normal.     Pupils: Pupils are equal, round, and reactive to light.  Cardiovascular:     Rate and Rhythm: Normal rate and regular rhythm.     Pulses: Normal pulses.  Pulmonary:     Effort: Pulmonary effort is normal. No respiratory distress.  Musculoskeletal:        General: Normal range of motion.     Cervical back: Normal range of motion.  Skin:    General: Skin is warm and dry.     Capillary Refill: Capillary refill takes less than 2 seconds.  Neurological:     General: No focal deficit present.     Mental Status: He is alert. Mental status is at baseline.  Psychiatric:        Mood  and Affect: Mood normal.     (all labs ordered are listed, but only abnormal results are displayed) Labs Reviewed - No data to display  EKG: None  Radiology: No results found.   Procedures   Medications Ordered in the ED - No data to display                                  Medical Decision Making  Patient presents to the ED for concern of dental pain, this involves an extensive number of treatment options, and is a complaint that carries with it a high risk of complications and morbidity.    The differential diagnosis includes: Dental abscess Dental carries Ludwig's angina   Co morbidities that complicate the patient evaluation: Chronic dental decay  Additional history obtained: Additional history obtained from Outside Medical Records and Past Admission  External records from outside source obtained and  reviewed including medical history, surgical history, medications, allergies. The patient was a reliable historian, providing a clear, detailed, and consistent account of the presenting symptoms and relevant medical history. The information was obtained directly from the patient and statements were documented in the patient's own words when possible. No discrepancies were noted between the history provided and available collateral sources.     Test Considered: Diagnostic testing was considered based on the patient's presenting symptoms, risk factors, and initial clinical assessment.  CT soft tissue was considered due to patient's facial swelling, however, given patient's history of chronic dental decay, afebrile nature, physical exam not indicative of cellulitis, airway compromise, or symptoms pointing towards an infection of submental, submandibular, or sublingual spaces, CT was deemed not needed at this time and antibiotic regimen with strict return precautions pursued. The approach to diagnostic testing prioritized exclusion of life-threatening conditions  Problem List / ED Course: Problem List: Dental pain Emergency Department Course: The patient presented with dental pain. Initial assessment included history, physical exam, and review of prior medical records.  Given patient history of chronic dental decay, report of chronic abscess with self draining at home, physical exam findings not indicating I&D needed at this time or indications of airway compromise, no sings of severe infection/cellulitis indicating such as a Ludwigs angina, or difficulty with swallowing or phonation, plan to discharge patient with antibiotic regimen and pain control regimen with close follow-up with dentistry contact provided at discharge in 1 week. Dispostion: After consideration of the patient's physical exam findings, I feel that the patent would benefit from discharge home with antibiotic regimen and close follow-up  with dentistry for further care and evaluation.   Clinical Assessment:    Working diagnosis: dental abscess  Disposition Plan: The patient is medically stable for discharge from the Emergency Department at this time. Vital signs are within normal limits, and the patient is alert, oriented, and in no acute distress. Evaluation has been completed with no findings necessitating hospital admission or further emergent workup.  Communication:   Patient informed of disposition decision and rationale. Questions addressed.  The clinical impression was discussed with the patient at bedside and the patient demonstrated understanding.     Final diagnoses:  Dental abscess    ED Discharge Orders          Ordered    amoxicillin -clavulanate (AUGMENTIN ) 875-125 MG tablet  2 times daily        10/23/24 1604    ibuprofen  (ADVIL ) 800 MG tablet  3 times daily  10/23/24 1604               Willma Duwaine CROME, [place] 10/23/24 1722    Charlyn Sora, MD 10/23/24 [phone]

## 2024-10-23 NOTE — ED Triage Notes (Signed)
 Pt presents with left sided facial swelling, lower jaw tooth pain, denies fevers, has not seen dentist, hx of same

## 2024-10-23 NOTE — Discharge Instructions (Addendum)
 Thank you for visiting the Emergency Department today. It was a pleasure to be part of your healthcare team.  You were seen today for a dental abscess.  You have been prescribed an antibiotic regimen, please take the entire course as prescribed. If you have any questions about your medicines, please call your pharmacy or healthcare provider. As discussed, it is important to watch for warning signs such as worsening pain, fever, trouble breathing, or increased swelling. If any of these happen, return to the Emergency Department or call 911. Thank you for trusting us  with your health.

## 2024-12-31 ENCOUNTER — Emergency Department (HOSPITAL_COMMUNITY)
Admission: EM | Admit: 2024-12-31 | Discharge: 2024-12-31 | Disposition: A | Discharge status: Home / Self Care | Payer: Self-pay

## 2024-12-31 ENCOUNTER — Other Ambulatory Visit: Payer: Self-pay

## 2024-12-31 ENCOUNTER — Encounter (HOSPITAL_COMMUNITY): Payer: Self-pay | Admitting: Emergency Medicine

## 2024-12-31 DIAGNOSIS — K029 Dental caries, unspecified: Secondary | ICD-10-CM | POA: Insufficient documentation

## 2024-12-31 DIAGNOSIS — K047 Periapical abscess without sinus: Secondary | ICD-10-CM | POA: Insufficient documentation

## 2024-12-31 MED ORDER — IBUPROFEN 800 MG PO TABS: 800.0000 mg | ORAL_TABLET | Freq: Three times a day (TID) | ORAL | 0 refills | Status: AC

## 2024-12-31 MED ORDER — AMOXICILLIN 500 MG PO CAPS: 500.0000 mg | ORAL_CAPSULE | Freq: Three times a day (TID) | ORAL | 0 refills | Status: AC

## 2024-12-31 MED ORDER — AMOXICILLIN 250 MG PO CAPS
500.0000 mg | ORAL_CAPSULE | Freq: Once | ORAL | Status: AC
  Administered 2024-12-31: 500 mg via ORAL
  Filled 2024-12-31: qty 2

## 2024-12-31 MED ORDER — IBUPROFEN 800 MG PO TABS
800.0000 mg | ORAL_TABLET | Freq: Once | ORAL | Status: AC
  Administered 2024-12-31: 800 mg via ORAL
  Filled 2024-12-31: qty 1

## 2024-12-31 NOTE — ED Notes (Signed)
 Pt/family received d/c paperwork at this time. After going over the paperwork any questions, comments, or concerns were answered to the best of this nurse's knowledge. The pt/family verbally acknowledged the teachings/instructions.

## 2024-12-31 NOTE — ED Provider Notes (Signed)
 " [place] EMERGENCY DEPARTMENT AT [facility] Provider Note   CSN: [id number] Arrival date & time: 12/31/24  1035     Patient presents with: Dental Pain   Brian Combs is a 34 y.o. male with known poor dentition which she has been struggling with for years, having occasional episodes of abscess formation, developed pain and swelling of his gingiva at his right lower mandible 3 days ago.  He denies fevers or chills, is not having difficulty swallowing, no nausea or vomiting.  He has taken Tylenol  without symptom relief.  He has just obtained dental insurance and is scheduled to see [facility] in [place] later this month.   The history is provided by the patient.       Prior to Admission medications  Medication Sig Start Date End Date Taking? Authorizing Provider  amoxicillin  (AMOXIL ) 500 MG capsule Take 1 capsule (500 mg total) by mouth 3 (three) times daily. 12/31/24  Yes Tye Juarez, PA-C  ibuprofen  (ADVIL ) 800 MG tablet Take 1 tablet (800 mg total) by mouth 3 (three) times daily. 12/31/24  Yes Lajada Janes, PA-C    Allergies: Peanut-containing drug products    Review of Systems  Constitutional:  Negative for fever.  HENT:  Positive for dental problem. Negative for facial swelling and sore throat.   Respiratory:  Negative for shortness of breath.   Musculoskeletal:  Negative for neck pain and neck stiffness.    Updated Vital Signs BP 129/77   Pulse 84   Temp 98 F (36.7 C)   Resp 18   Ht 5' 11 (1.803 m)   Wt 80.7 kg   SpO2 99%   BMI 24.83 kg/m   Physical Exam Constitutional:      General: He is not in acute distress.    Appearance: He is well-developed.  HENT:     Head: Normocephalic and atraumatic.     Jaw: No trismus or malocclusion.     Right Ear: External ear normal.     Left Ear: External ear normal.     Mouth/Throat:     Mouth: No oral lesions.     Dentition: Abnormal dentition. Gingival swelling, dental caries and dental abscesses  present.     Tongue: Tongue does not deviate from midline.     Pharynx: Uvula midline.     Comments: Mild gingival edema with erythema at the left lower mandible at 22 through 24.  He has soft edema of the buccal mucosa as well at this location.  No lymphadenopathy, sublingual space is soft.  Uvula midline. Eyes:     Conjunctiva/sclera: Conjunctivae normal.  Cardiovascular:     Rate and Rhythm: Normal rate.     Heart sounds: Normal heart sounds.  Pulmonary:     Effort: Pulmonary effort is normal.  Musculoskeletal:        General: Normal range of motion.     Cervical back: Normal range of motion and neck supple.  Lymphadenopathy:     Cervical: No cervical adenopathy.  Skin:    General: Skin is warm and dry.     Findings: No erythema.  Neurological:     Mental Status: He is alert and oriented to person, place, and time.     (all labs ordered are listed, but only abnormal results are displayed) Labs Reviewed - No data to display  EKG: None  Radiology: No results found.   Procedures   Medications Ordered in the ED  amoxicillin  (AMOXIL ) capsule 500 mg (has  no administration in time range)  ibuprofen  (ADVIL ) tablet 800 mg (has no administration in time range)                                    Medical Decision Making Patient presenting with left lower dental pain with obvious abscess, there were 2 small areas of tenting, he was offered needle aspiration which he declined.  There is no facial cellulitis, sublingual space is soft, no indication for developing Ludwig's angina.  He was placed on antibiotics and encouraged follow-up with dentistry as he is planning.  Return precautions were outlined.  Risk Prescription drug management.        Final diagnoses:  Dental abscess    ED Discharge Orders          Ordered    amoxicillin  (AMOXIL ) 500 MG capsule  3 times daily        12/31/24 1126    ibuprofen  (ADVIL ) 800 MG tablet  3 times daily        12/31/24 1126                Jahnavi Muratore, PA-C 12/31/24 1127  "

## 2024-12-31 NOTE — Discharge Instructions (Addendum)
 Complete your entire course of antibiotics as prescribed.   Avoid applying heat or ice to this abscess area which can worsen your symptoms.  You may use warm salt water  swish and spit treatment or half peroxide and water  swish and spit after meals to keep this area clean as discussed.  Plan follow-up care with your dentist as planned.

## 2024-12-31 NOTE — ED Triage Notes (Signed)
 Patient c/o left sided jaw pain x 3 days.
